# Patient Record
Sex: Male | Born: 1990 | Race: Black or African American | Hispanic: No | Marital: Single | Smoking: Current every day smoker
Health system: Southern US, Community
[De-identification: ages and names within clinical notes are randomized; demographics above are authoritative.]

## PROBLEM LIST (undated history)

## (undated) DIAGNOSIS — J45909 Unspecified asthma, uncomplicated: Secondary | ICD-10-CM

## (undated) HISTORY — PX: ABDOMINAL SURGERY: SHX537

---
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
## 2008-03-31 ENCOUNTER — Emergency Department: Payer: Self-pay | Admitting: Emergency Medicine

## 2009-04-01 ENCOUNTER — Emergency Department: Payer: Self-pay | Admitting: Emergency Medicine

## 2009-11-26 ENCOUNTER — Emergency Department: Payer: Self-pay | Admitting: Emergency Medicine

## 2014-10-30 ENCOUNTER — Emergency Department: Payer: Self-pay | Admitting: Emergency Medicine

## 2014-10-30 LAB — URINALYSIS, COMPLETE
Bacteria: NONE SEEN
Bilirubin,UR: NEGATIVE
Glucose,UR: NEGATIVE mg/dL (ref 0–75)
KETONE: NEGATIVE
Nitrite: NEGATIVE
PH: 8 (ref 4.5–8.0)
Protein: 100
Specific Gravity: 1.02 (ref 1.003–1.030)
Squamous Epithelial: NONE SEEN
WBC UR: 1195 /HPF (ref 0–5)

## 2014-10-30 LAB — GC/CHLAMYDIA PROBE AMP

## 2014-11-01 LAB — URINE CULTURE

## 2014-12-11 ENCOUNTER — Emergency Department: Payer: Self-pay | Admitting: Emergency Medicine

## 2015-05-09 ENCOUNTER — Encounter: Payer: Self-pay | Admitting: Emergency Medicine

## 2015-05-09 ENCOUNTER — Emergency Department
Admission: EM | Admit: 2015-05-09 | Discharge: 2015-05-09 | Disposition: A | Discharge status: Home / Self Care | Payer: Self-pay | Attending: Student | Admitting: Student

## 2015-05-09 DIAGNOSIS — X58XXXA Exposure to other specified factors, initial encounter: Secondary | ICD-10-CM | POA: Insufficient documentation

## 2015-05-09 DIAGNOSIS — Y9289 Other specified places as the place of occurrence of the external cause: Secondary | ICD-10-CM | POA: Insufficient documentation

## 2015-05-09 DIAGNOSIS — Y998 Other external cause status: Secondary | ICD-10-CM | POA: Insufficient documentation

## 2015-05-09 DIAGNOSIS — S30812A Abrasion of penis, initial encounter: Secondary | ICD-10-CM | POA: Insufficient documentation

## 2015-05-09 DIAGNOSIS — Z720 Tobacco use: Secondary | ICD-10-CM | POA: Insufficient documentation

## 2015-05-09 DIAGNOSIS — Y9389 Activity, other specified: Secondary | ICD-10-CM | POA: Insufficient documentation

## 2015-05-09 HISTORY — DX: Unspecified asthma, uncomplicated: J45.909

## 2015-05-09 LAB — URINALYSIS COMPLETE WITH MICROSCOPIC (ARMC ONLY)
Bilirubin Urine: NEGATIVE
Glucose, UA: NEGATIVE mg/dL
HGB URINE DIPSTICK: NEGATIVE
Ketones, ur: NEGATIVE mg/dL
Leukocytes, UA: NEGATIVE
Nitrite: NEGATIVE
PH: 6 (ref 5.0–8.0)
Protein, ur: 30 mg/dL — AB
SPECIFIC GRAVITY, URINE: 1.031 — AB (ref 1.005–1.030)

## 2015-05-09 NOTE — Discharge Instructions (Signed)
Abrasions An abrasion is a cut or scrape of the skin. Abrasions do not go through all layers of the skin. HOME CARE  If a bandage (dressing) was put on your wound, change it as told by your doctor. If the bandage sticks, soak it off with warm.  Wash the area with water and soap 2 times a day. Rinse off the soap. Pat the area dry with a clean towel.  Put on medicated cream (ointment) as told by your doctor.  Change your bandage right away if it gets wet or dirty.  Only take medicine as told by your doctor.  See your doctor within 24-48 hours to get your wound checked.  Check your wound for redness, puffiness (swelling), or yellowish-white fluid (pus). GET HELP RIGHT AWAY IF:   You have more pain in the wound.  You have redness, swelling, or tenderness around the wound.  You have pus coming from the wound.  You have a fever or lasting symptoms for more than 2-3 days.  You have a fever and your symptoms suddenly get worse.  You have a bad smell coming from the wound or bandage. MAKE SURE YOU:   Understand these instructions.  Will watch your condition.  Will get help right away if you are not doing well or get worse. Document Released: 05/16/2008 Document Revised: 08/22/2012 Document Reviewed: 11/01/2011 Exit[employer] Patient Information 2015 [employer]. This information is not intended to replace advice given to you by your health care provider. Make sure you discuss any questions you have with your health care provider.  

## 2015-05-09 NOTE — ED Provider Notes (Signed)
[facility] Emergency Department Provider Note  ____________________________________________  Time seen: Approximately 8:41 AM  I have reviewed the triage vital signs and the nursing notes.   HISTORY  Chief Complaint Groin Swelling    HPI Jeffrey Aguirre is a 24 y.o. male patient state he knows some mild pedal edema this morning upon awakening. Denies any dysuria or discharge from the penis. Patient state he is concerned because he was treated for STD a few months ago. State he has been monogamous since the incident. He stated there is no pain but he is concerned since he is is getting married in a couple weeks.   Past Medical History  Diagnosis Date  . Asthma     There are no active problems to display for this patient.   Past Surgical History  Procedure Laterality Date  . Abdominal surgery      No current outpatient prescriptions on file.  Allergies Review of patient's allergies indicates no known allergies.  No family history on file.  Social History History  Substance Use Topics  . Smoking status: Current Every Day Smoker -- 0.50 packs/day    Types: Cigarettes  . Smokeless tobacco: Not on file  . Alcohol Use: Not on file    Review of Systems Constitutional: No fever/chills Eyes: No visual changes. ENT: No sore throat. Cardiovascular: Denies chest pain. Respiratory: Denies shortness of breath. Gastrointestinal: No abdominal pain.  No nausea, no vomiting.  No diarrhea.  No constipation. Genitourinary: Negative for dysuria. States mild edema to the penis meatus. Musculoskeletal: Negative for back pain. Skin: Negative for rash. Neurological: Negative for headaches, focal weakness or numbness. 10-point ROS otherwise negative.  ____________________________________________   PHYSICAL EXAM:  VITAL SIGNS: ED Triage Vitals  Enc Vitals Group     BP 05/09/15 0817 139/87 mmHg     Pulse Rate 05/09/15 0817 77     Resp --      Temp  05/09/15 0817 98.1 F (36.7 C)     Temp Source 05/09/15 0817 Oral     SpO2 05/09/15 0817 98 %     Weight 05/09/15 0817 185 lb (83.915 kg)     Height 05/09/15 0817 5\' 9"  (1.753 m)     Head Cir --      Peak Flow --      Pain Score 05/09/15 0818 0     Pain Loc --      Pain Edu? --      Excl. in GC? --    Constitutional: Alert and oriented. Well appearing and in no acute distress. Eyes: Conjunctivae are normal. PERRL. EOMI. Head: Atraumatic. Nose: No congestion/rhinnorhea. Mouth/Throat: Mucous membranes are moist.  Oropharynx non-erythematous. Neck: No stridor.   Hematological/Lymphatic/Immunilogical: No inguinal adenoapthy. Cardiovascular: Normal rate, regular rhythm. Grossly normal heart sounds.  Good peripheral circulation. Respiratory: Normal respiratory effort.  No retractions. Lungs CTAB. Gastrointestinal: Soft and nontender. No distention. No abdominal bruits. No CVA tenderness. Genitourinary: There is lesion consistent for an abrasion to the meatus Musculoskeletal: No lower extremity tenderness nor edema.  No joint effusions. Neurologic:  Normal speech and language. No gross focal neurologic deficits are appreciated. Speech is normal. No gait instability. Skin:  Skin is warm, dry and intact. No rash noted. Psychiatric: Mood and affect are normal. Speech and behavior are normal.  ____________________________________________   LABS (all labs ordered are listed, but only abnormal results are displayed)  Labs Reviewed  URINALYSIS COMPLETEWITH MICROSCOPIC ([facility] ONLY) - Abnormal; Notable for the following:  Color, Urine YELLOW (*)    APPearance CLEAR (*)    Specific Gravity, Urine 1.031 (*)    Protein, ur 30 (*)    Bacteria, UA RARE (*)    Squamous Epithelial / LPF 0-5 (*)    All other components within normal limits    ____________________________________________  EKG   ____________________________________________  RADIOLOGY   ____________________________________________   PROCEDURES  Procedure(s) performed: None  Critical Care performed: No  ____________________________________________   INITIAL IMPRESSION / ASSESSMENT AND PLAN / ED COURSE  Pertinent labs & imaging results that were available during my care of the patient were reviewed by me and considered in my medical decision making (see chart for details). Penial abrasion ____________________________________________   FINAL CLINICAL IMPRESSION(S) / ED DIAGNOSES  Final diagnoses:  Penile abrasion, initial encounter      Joni Reining, PA-C 05/09/15 0981  Gayla Doss, MD 05/09/15 [phone]

## 2015-05-09 NOTE — ED Notes (Signed)
Penis swelling , noticed this am, no injury. Hx STD's a few months ago with treatment

## 2015-11-13 ENCOUNTER — Emergency Department
Admission: EM | Admit: 2015-11-13 | Discharge: 2015-11-13 | Disposition: A | Discharge status: Home / Self Care | Payer: Self-pay | Attending: Emergency Medicine | Admitting: Emergency Medicine

## 2015-11-13 ENCOUNTER — Encounter: Payer: Self-pay | Admitting: Emergency Medicine

## 2015-11-13 DIAGNOSIS — F17210 Nicotine dependence, cigarettes, uncomplicated: Secondary | ICD-10-CM | POA: Insufficient documentation

## 2015-11-13 DIAGNOSIS — W2209XA Striking against other stationary object, initial encounter: Secondary | ICD-10-CM | POA: Insufficient documentation

## 2015-11-13 DIAGNOSIS — Y92002 Bathroom of unspecified non-institutional (private) residence single-family (private) house as the place of occurrence of the external cause: Secondary | ICD-10-CM | POA: Insufficient documentation

## 2015-11-13 DIAGNOSIS — Y990 Civilian activity done for income or pay: Secondary | ICD-10-CM | POA: Insufficient documentation

## 2015-11-13 DIAGNOSIS — Y9389 Activity, other specified: Secondary | ICD-10-CM | POA: Insufficient documentation

## 2015-11-13 DIAGNOSIS — K4090 Unilateral inguinal hernia, without obstruction or gangrene, not specified as recurrent: Secondary | ICD-10-CM | POA: Insufficient documentation

## 2015-11-13 DIAGNOSIS — S3994XA Unspecified injury of external genitals, initial encounter: Secondary | ICD-10-CM | POA: Insufficient documentation

## 2015-11-13 LAB — URINALYSIS COMPLETE WITH MICROSCOPIC (ARMC ONLY)
BILIRUBIN URINE: NEGATIVE
Bacteria, UA: NONE SEEN
Glucose, UA: NEGATIVE mg/dL
HGB URINE DIPSTICK: NEGATIVE
Ketones, ur: NEGATIVE mg/dL
LEUKOCYTES UA: NEGATIVE
Nitrite: NEGATIVE
PH: 6 (ref 5.0–8.0)
PROTEIN: 30 mg/dL — AB
Specific Gravity, Urine: 1.033 — ABNORMAL HIGH (ref 1.005–1.030)

## 2015-11-13 MED ORDER — NAPROXEN 500 MG PO TABS: 500.0000 mg | ORAL_TABLET | Freq: Two times a day (BID) | ORAL | Status: DC

## 2015-11-13 NOTE — ED Provider Notes (Signed)
[facility] Emergency Department Provider Note  ____________________________________________  Time seen: Approximately 11:09 AM  I have reviewed the triage vital signs and the nursing notes.   HISTORY  Chief Complaint Groin Pain    HPI Jeffrey Aguirre is a 24 y.o. male who presents emergency department for 2 complaints. He states that he was at work when he sat down to use the bathroom and "my penis hit the ball and water." He states that since then he has noticed that the glans of his penis looks "different." He denies any sores, drainage or discharge, dysuria, hematuria, polyuria. Patient denies any fevers or chills or abdominal pain. Patient is also complaining of a "bulge in my groin." He states that his in there for "a while." He states that it is painful if he palpates the area or does a lot of lifting. Bulges on the left side. He denies any change in baseline pain. There is no pain without movement or palpation. It is mild to moderate.   Past Medical History  Diagnosis Date  . Asthma     There are no active problems to display for this patient.   Past Surgical History  Procedure Laterality Date  . Abdominal surgery      Current Outpatient Rx  Name  Route  Sig  Dispense  Refill  . naproxen (NAPROSYN) 500 MG tablet   Oral   Take 1 tablet (500 mg total) by mouth 2 (two) times daily with a meal.   60 tablet   2     Allergies Review of patient's allergies indicates no known allergies.  No family history on file.  Social History Social History  Substance Use Topics  . Smoking status: Current Every Day Smoker -- 0.50 packs/day    Types: Cigarettes  . Smokeless tobacco: None  . Alcohol Use: None    Review of Systems Constitutional: No fever/chills Eyes: No visual changes. ENT: No sore throat. Cardiovascular: Denies chest pain. Respiratory: Denies shortness of breath. Gastrointestinal: No abdominal pain.  No nausea, no vomiting.  No  diarrhea.  No constipation. Genitourinary: Negative for dysuria. Endorses swollen penile head. Endorses bulge in left groin. Musculoskeletal: Negative for back pain. Skin: Negative for rash. Neurological: Negative for headaches, focal weakness or numbness.  10-point ROS otherwise negative.  ____________________________________________   PHYSICAL EXAM:  VITAL SIGNS: ED Triage Vitals  Enc Vitals Group     BP 11/13/15 1036 131/86 mmHg     Pulse Rate 11/13/15 1036 81     Resp 11/13/15 1036 18     Temp 11/13/15 1036 98.3 F (36.8 C)     Temp Source 11/13/15 1036 Oral     SpO2 11/13/15 1036 98 %     Weight 11/13/15 1036 200 lb (90.719 kg)     Height 11/13/15 1036  (1.753 m)     Head Cir --      Peak Flow --      Pain Score 11/13/15 1013 7     Pain Loc --      Pain Edu? --      Excl. in GC? --     Constitutional: Alert and oriented. Well appearing and in no acute distress. Eyes: Conjunctivae are normal. PERRL. EOMI. Head: Atraumatic. Nose: No congestion/rhinnorhea. Mouth/Throat: Mucous membranes are moist.  Oropharynx non-erythematous. Neck: No stridor.   Cardiovascular: Normal rate, regular rhythm. Grossly normal heart sounds.  Good peripheral circulation. Respiratory: Normal respiratory effort.  No retractions. Lungs CTAB. Gastrointestinal: Soft  and nontender. No distention. No abdominal bruits. No CVA tenderness. Genitourinary: External genitalia exam. No gross abnormality visualized. No chancres or lesions are noted. No discharge is noted. Urethral meatus is unremarkable. Testes and scrotum are unremarkable to examination. Palpation to the inguinal canal reveals a bulge and left inguinal canal. Right inguinal canal is unremarkable. Musculoskeletal: No lower extremity tenderness nor edema.  No joint effusions. Neurologic:  Normal speech and language. No gross focal neurologic deficits are appreciated. No gait instability. Skin:  Skin is warm, dry and intact. No rash  noted. Psychiatric: Mood and affect are normal. Speech and behavior are normal.  ____________________________________________   LABS (all labs ordered are listed, but only abnormal results are displayed)  Labs Reviewed  URINALYSIS COMPLETEWITH MICROSCOPIC ([facility] ONLY) - Abnormal; Notable for the following:    Color, Urine AMBER (*)    APPearance CLEAR (*)    Specific Gravity, Urine 1.033 (*)    Protein, ur 30 (*)    Squamous Epithelial / LPF 0-5 (*)    All other components within normal limits   ____________________________________________  EKG   ____________________________________________  RADIOLOGY   ____________________________________________   PROCEDURES  Procedure(s) performed: None  Critical Care performed: No  ____________________________________________   INITIAL IMPRESSION / ASSESSMENT AND PLAN / ED COURSE  Pertinent labs & imaging results that were available during my care of the patient were reviewed by me and considered in my medical decision making (see chart for details).  The patient's history, symptoms, physical exam are consistent with left-sided inguinal hernia. No signs of infection in urinalysis. I'll discharge the patient home with anti-inflammatories for symptomatic control if he will hernia advised patient to follow-up with general surgeon. Patient verbalizes understanding of the diagnosis and treatment plan and verbalizes compliance with same. ____________________________________________   FINAL CLINICAL IMPRESSION(S) / ED DIAGNOSES  Final diagnoses:  Left inguinal hernia      Racheal PatchesJonathan D Jlyn Bracamonte, PA-C 11/13/15 1240  Emily FilbertJonathan E Williams, MD 11/13/15 1323

## 2015-11-13 NOTE — Discharge Instructions (Signed)

## 2015-11-13 NOTE — ED Notes (Signed)
States he fell   Having pain to groin area

## 2015-12-17 ENCOUNTER — Ambulatory Visit: Payer: Self-pay | Admitting: Surgery

## 2016-01-20 ENCOUNTER — Ambulatory Visit (INDEPENDENT_AMBULATORY_CARE_PROVIDER_SITE_OTHER): Payer: Self-pay | Admitting: Surgery

## 2016-01-20 DIAGNOSIS — Z029 Encounter for administrative examinations, unspecified: Secondary | ICD-10-CM

## 2016-01-22 NOTE — Progress Notes (Signed)
No show

## 2016-11-10 ENCOUNTER — Emergency Department
Admission: EM | Admit: 2016-11-10 | Discharge: 2016-11-10 | Disposition: A | Discharge status: Home / Self Care | Payer: Self-pay | Attending: Emergency Medicine | Admitting: Emergency Medicine

## 2016-11-10 ENCOUNTER — Encounter: Payer: Self-pay | Admitting: Emergency Medicine

## 2016-11-10 DIAGNOSIS — R112 Nausea with vomiting, unspecified: Secondary | ICD-10-CM | POA: Insufficient documentation

## 2016-11-10 DIAGNOSIS — J45909 Unspecified asthma, uncomplicated: Secondary | ICD-10-CM | POA: Insufficient documentation

## 2016-11-10 DIAGNOSIS — F17210 Nicotine dependence, cigarettes, uncomplicated: Secondary | ICD-10-CM | POA: Insufficient documentation

## 2016-11-10 DIAGNOSIS — R197 Diarrhea, unspecified: Secondary | ICD-10-CM | POA: Insufficient documentation

## 2016-11-10 LAB — COMPREHENSIVE METABOLIC PANEL
ALBUMIN: 4.3 g/dL (ref 3.5–5.0)
ALT: 17 U/L (ref 17–63)
AST: 22 U/L (ref 15–41)
Alkaline Phosphatase: 40 U/L (ref 38–126)
Anion gap: 6 (ref 5–15)
BILIRUBIN TOTAL: 0.4 mg/dL (ref 0.3–1.2)
BUN: 9 mg/dL (ref 6–20)
CHLORIDE: 107 mmol/L (ref 101–111)
CO2: 25 mmol/L (ref 22–32)
CREATININE: 1.23 mg/dL (ref 0.61–1.24)
Calcium: 9 mg/dL (ref 8.9–10.3)
GFR calc Af Amer: >60 mL/min (ref >60)
GLUCOSE: 93 mg/dL (ref 65–99)
Potassium: 3.9 mmol/L (ref 3.5–5.1)
Sodium: 138 mmol/L (ref 135–145)
TOTAL PROTEIN: 7.3 g/dL (ref 6.5–8.1)

## 2016-11-10 LAB — CBC
HEMATOCRIT: 45.8 % (ref 40.0–52.0)
Hemoglobin: 15.9 g/dL (ref 13.0–18.0)
MCH: 33.4 pg (ref 26.0–34.0)
MCHC: 34.8 g/dL (ref 32.0–36.0)
MCV: 96 fL (ref 80.0–100.0)
PLATELETS: 217 10*3/uL (ref 150–440)
RBC: 4.77 MIL/uL (ref 4.40–5.90)
RDW: 14.3 % (ref 11.5–14.5)
WBC: 7.8 10*3/uL (ref 3.8–10.6)

## 2016-11-10 LAB — LIPASE, BLOOD: Lipase: 37 U/L (ref 11–51)

## 2016-11-10 MED ORDER — ONDANSETRON HCL 4 MG PO TABS: 4.0000 mg | ORAL_TABLET | Freq: Three times a day (TID) | ORAL | 0 refills | Status: DC | PRN

## 2016-11-10 MED ORDER — SODIUM CHLORIDE 0.9 % IV BOLUS (SEPSIS)
1000.0000 mL | Freq: Once | INTRAVENOUS | Status: AC
  Administered 2016-11-10: 1000 mL via INTRAVENOUS

## 2016-11-10 MED ORDER — ONDANSETRON HCL 4 MG/2ML IJ SOLN
4.0000 mg | Freq: Once | INTRAMUSCULAR | Status: AC
  Administered 2016-11-10: 4 mg via INTRAVENOUS
  Filled 2016-11-10: qty 2

## 2016-11-10 NOTE — ED Notes (Signed)
Pt sound asleep at present.. Will continue to monitor the pt. 

## 2016-11-10 NOTE — ED Provider Notes (Signed)
[facility] Emergency Department Provider Note ____________________________________________   I have reviewed the triage vital signs and the triage nursing note.  HISTORY  Chief Complaint Emesis and Diarrhea   Historian Patient  HPI Jeffrey Aguirre is a 25 y.o. male present for 24 hours of n/v/d - reports over 30x vomiting, nonbloody, yesterday.  Twice this AM.  Several loose BM.  Moderate abdominal cramping which is waxing and waning in intensity, currently mild.  No significant travel history, no known food exposures. No known sick contacts.  Denies fever. Denies headache, sinus congestion, coughing, chest pain, skin rash.    Past Medical History:  Diagnosis Date  . Asthma     There are no active problems to display for this patient.   Past Surgical History:  Procedure Laterality Date  . ABDOMINAL SURGERY      Prior to Admission medications   Medication Sig Start Date End Date Taking? Authorizing Provider  ondansetron (ZOFRAN) 4 MG tablet Take 1 tablet (4 mg total) by mouth every 8 (eight) hours as needed for nausea or vomiting. 11/10/16   Governor Rooksebecca Donae Kueker, MD    No Known Allergies  No family history on file.  Social History Social History  Substance Use Topics  . Smoking status: Current Every Day Smoker    Packs/day: 0.50    Types: Cigarettes  . Smokeless tobacco: Never Used  . Alcohol use Not on file    Review of Systems  Constitutional: Negative for fever. Eyes: Negative for visual changes. ENT: Negative for sore throat. Cardiovascular: Negative for chest pain. Respiratory: Negative for shortness of breath. Gastrointestinal:As per history of present illness Genitourinary: Negative for dysuria. Musculoskeletal: Negative for back pain. Skin: Negative for rash. Neurological: Negative for headache. 10 point Review of Systems otherwise negative ____________________________________________   PHYSICAL EXAM:  VITAL SIGNS: ED  Triage Vitals   Enc Vitals Group     BP 136/87     Pulse Rate 90     Resp 16     Temp 98.2 F (36.8 C)     Temp Source Oral     SpO2 99 %     Weight 175 lb (79.4 kg)     Height 5' 8.5" (1.74 m)     Head Circumference      Peak Flow      Pain Score      Pain Loc      Pain Edu?      Excl. in GC?      Constitutional: Alert and oriented. Well appearing and in no distress. HEENT   Head: Normocephalic and atraumatic.      Eyes: Conjunctivae are normal. PERRL. Normal extraocular movements.      Ears:         Nose: No congestion/rhinnorhea.   Mouth/Throat: Mucous membranes are moderately dry.   Neck: No stridor. Cardiovascular/Chest: Normal rate, regular rhythm.  No murmurs, rubs, or gallops. Respiratory: Normal respiratory effort without tachypnea nor retractions. Breath sounds are clear and equal bilaterally. No wheezes/rales/rhonchi. Gastrointestinal: Soft. No distention, no guarding, no rebound. Just very mild tenderness diffusely especially in the upper abdomen. No focal right lower quadrant tenderness.  Genitourinary/rectal:Deferred Musculoskeletal: Nontender with normal range of motion in all extremities. No joint effusions.  No lower extremity tenderness.  No edema. Neurologic:  Normal speech and language. No gross or focal neurologic deficits are appreciated. Skin:  Skin is warm, dry and intact. No rash noted. Psychiatric: Mood and affect are normal. Speech and behavior  are normal. Patient exhibits appropriate insight and judgment.   ____________________________________________  LABS (pertinent positives/negatives)  Labs Reviewed  LIPASE, BLOOD  COMPREHENSIVE METABOLIC PANEL  CBC  URINALYSIS COMPLETEWITH MICROSCOPIC ([facility] ONLY)    ____________________________________________    EKG I, Governor Rooksebecca Cabrina Shiroma, MD, the attending physician have personally viewed and interpreted all ECGs.  None ____________________________________________  RADIOLOGY All Xrays  were viewed by me. Imaging interpreted by Radiologist.  None __________________________________________  PROCEDURES  Procedure(s) performed: None  Critical Care performed: None  ____________________________________________   ED COURSE / ASSESSMENT AND PLAN  Pertinent labs & imaging results that were available during my care of the patient were reviewed by me and considered in my medical decision making (see chart for details).   Jeffrey Aguirre is here with vomiting and diarrhea, likely viral given was going on in the community right now. He is overall well-appearing without a fever here, with stable vital signs. He does look dehydrated on exam in terms of dry mucous members of the mouth. I am going to give him IV fluids, nausea medication. Abdominal discomfort is very mild and I'm not concerned about intra-abdominal emergency condition such as appendicitis and I'm not recommending imaging at this point in time.   CONSULTATIONS:  None   Patient / Family / Caregiver informed of clinical course, medical decision-making process, and agree with plan.   I discussed return precautions, follow-up instructions, and discharge instructions with patient and/or family.   ___________________________________________   FINAL CLINICAL IMPRESSION(S) / ED DIAGNOSES   Final diagnoses:  Nausea vomiting and diarrhea              Note: This dictation was prepared with Dragon dictation. Any transcriptional errors that result from this process are unintentional    Governor Rooksebecca Brittan Mapel, MD 11/10/16 1327

## 2016-11-10 NOTE — ED Triage Notes (Signed)
Pt with vomiting and diarrhea started yesterday with some abd pain.

## 2016-11-10 NOTE — Discharge Instructions (Signed)
You were evaluated after vomiting and diarrhea, as we discussed your exam and evaluation are reassuring in the emergency department today and I suspect you have a GI virus.  Continue to drink plenty of fluids. Return to the emergency room for any fever, worsening abdominal pain, continued fluid losses through vomiting and diarrhea with concern for dehydration such as dry mouth and not making urine, dizziness or passing out, or any other symptoms concerning to you.

## 2016-11-23 ENCOUNTER — Emergency Department
Admission: EM | Admit: 2016-11-23 | Discharge: 2016-11-23 | Disposition: A | Discharge status: Home / Self Care | Payer: Self-pay | Attending: Emergency Medicine | Admitting: Emergency Medicine

## 2016-11-23 DIAGNOSIS — L089 Local infection of the skin and subcutaneous tissue, unspecified: Secondary | ICD-10-CM | POA: Insufficient documentation

## 2016-11-23 DIAGNOSIS — Y939 Activity, unspecified: Secondary | ICD-10-CM | POA: Insufficient documentation

## 2016-11-23 DIAGNOSIS — Y929 Unspecified place or not applicable: Secondary | ICD-10-CM | POA: Insufficient documentation

## 2016-11-23 DIAGNOSIS — S50861A Insect bite (nonvenomous) of right forearm, initial encounter: Secondary | ICD-10-CM | POA: Insufficient documentation

## 2016-11-23 DIAGNOSIS — Y999 Unspecified external cause status: Secondary | ICD-10-CM | POA: Insufficient documentation

## 2016-11-23 DIAGNOSIS — J45909 Unspecified asthma, uncomplicated: Secondary | ICD-10-CM | POA: Insufficient documentation

## 2016-11-23 DIAGNOSIS — W57XXXA Bitten or stung by nonvenomous insect and other nonvenomous arthropods, initial encounter: Secondary | ICD-10-CM | POA: Insufficient documentation

## 2016-11-23 DIAGNOSIS — F17210 Nicotine dependence, cigarettes, uncomplicated: Secondary | ICD-10-CM | POA: Insufficient documentation

## 2016-11-23 DIAGNOSIS — S50869A Insect bite (nonvenomous) of unspecified forearm, initial encounter: Secondary | ICD-10-CM

## 2016-11-23 MED ORDER — SULFAMETHOXAZOLE-TRIMETHOPRIM 800-160 MG PO TABS: 1.0000 | ORAL_TABLET | Freq: Two times a day (BID) | ORAL | 0 refills | Status: DC

## 2016-11-23 MED ORDER — METHYLPREDNISOLONE SODIUM SUCC 125 MG IJ SOLR
80.0000 mg | Freq: Once | INTRAMUSCULAR | Status: AC
  Administered 2016-11-23: 80 mg via INTRAMUSCULAR
  Filled 2016-11-23: qty 2

## 2016-11-23 MED ORDER — HYDROXYZINE HCL 50 MG PO TABS
50.0000 mg | ORAL_TABLET | Freq: Once | ORAL | Status: AC
  Administered 2016-11-23: 50 mg via ORAL
  Filled 2016-11-23: qty 1

## 2016-11-23 MED ORDER — HYDROXYZINE HCL 50 MG PO TABS: 50.0000 mg | ORAL_TABLET | Freq: Three times a day (TID) | ORAL | 0 refills | Status: DC | PRN

## 2016-11-23 NOTE — ED Provider Notes (Signed)
[facility] Emergency Department Provider Note   ____________________________________________   First MD Initiated Contact with Patient 11/23/16 1135     (approximate)  I have reviewed the triage vital signs and the nursing notes.   HISTORY  Chief Complaint Wrist Pain    HPI Jeffrey Aguirre is a 25 y.o. male patient complained edema and erythema to right forearm. Patient states status post insect bite 2 days ago. Patient state pain increased this morning he rates as 8/10. Patient also stated this intense itching with this complaint. Patient states transient relief taking Benadryl yesterday. Patient denies loss of sensation or loss of function of the right upper extremity. Patient denies any fever with this complaint.   Past Medical History:  Diagnosis Date  . Asthma     There are no active problems to display for this patient.   Past Surgical History:  Procedure Laterality Date  . ABDOMINAL SURGERY      Prior to Admission medications   Medication Sig Start Date End Date Taking? Authorizing Provider  hydrOXYzine (ATARAX/VISTARIL) 50 MG tablet Take 1 tablet (50 mg total) by mouth 3 (three) times daily as needed for itching. 11/23/16   Joni Reiningonald K Antwan Pandya, PA-C  ondansetron (ZOFRAN) 4 MG tablet Take 1 tablet (4 mg total) by mouth every 8 (eight) hours as needed for nausea or vomiting. 11/10/16   Governor Rooksebecca Lord, MD  sulfamethoxazole-trimethoprim (BACTRIM DS,SEPTRA DS) 800-160 MG tablet Take 1 tablet by mouth 2 (two) times daily. 11/23/16   Joni Reiningonald K Bhavya Grand, PA-C    Allergies Patient has no known allergies.  No family history on file.  Social History Social History  Substance Use Topics  . Smoking status: Current Every Day Smoker    Packs/day: 0.50    Types: Cigarettes  . Smokeless tobacco: Never Used  . Alcohol use Not on file    Review of Systems Constitutional: No fever/chills Eyes: No visual changes. ENT: No sore throat. Cardiovascular:  Denies chest pain. Respiratory: Denies shortness of breath. Gastrointestinal: No abdominal pain.  No nausea, no vomiting.  No diarrhea.  No constipation. Genitourinary: Negative for dysuria. Musculoskeletal: Negative for back pain. Skin: Negative for rash. Redness and swelling to right forearm.  Neurological: Negative for headaches, focal weakness or numbness.    ____________________________________________   PHYSICAL EXAM:  VITAL SIGNS: ED Triage Vitals   Enc Vitals Group     BP 130/80     Pulse Rate 72     Resp 17     Temp 98 F (36.7 C)     Temp Source Oral     SpO2 99 %     Weight 175 lb (79.4 kg)     Height 5\' 8"  (1.727 m)     Head Circumference      Peak Flow      Pain Score 8     Pain Loc      Pain Edu?      Excl. in GC?     Constitutional: Alert and oriented. Well appearing and in no acute distress. Eyes: Conjunctivae are normal. PERRL. EOMI. Head: Atraumatic. Nose: No congestion/rhinnorhea. Mouth/Throat: Mucous membranes are moist.  Oropharynx non-erythematous. Neck: No stridor.  No cervical spine tenderness to palpation. Hematological/Lymphatic/Immunilogical: No cervical lymphadenopathy. Cardiovascular: Normal rate, regular rhythm. Grossly normal heart sounds.  Good peripheral circulation. Respiratory: Normal respiratory effort.  No retractions. Lungs CTAB. Gastrointestinal: Soft and nontender. No distention. No abdominal bruits. No CVA tenderness. Musculoskeletal: No lower extremity tenderness nor edema.  No joint effusions. Neurologic:  Normal speech and language. No gross focal neurologic deficits are appreciated. No gait instability. Skin:  Skin is warm, dry and intact. No rash noted.Edema and erythema right forearm. Psychiatric: Mood and affect are normal. Speech and behavior are normal.  ____________________________________________   LABS (all labs ordered are listed, but only abnormal results are displayed)  Labs Reviewed - No data to  display ____________________________________________  EKG  ____________________________________________  RADIOLOGY   ____________________________________________   PROCEDURES  Procedure(s) performed:   Procedures  Critical Care performed: No  ____________________________________________   INITIAL IMPRESSION / ASSESSMENT AND PLAN / ED COURSE  Pertinent labs & imaging results that were available during my care of the patient were reviewed by me and considered in my medical decision making (see chart for details).  Cellulitis secondary to insect bite. Patient given discharge care instructions. Patient given a prescription for Bactrim DS and Atarax. Patient advised [facility].  Clinical Course   Patient given Solu Medrol and Atarax prior to departure.   ____________________________________________   FINAL CLINICAL IMPRESSION(S) / ED DIAGNOSES  Final diagnoses:  Infected insect bite of forearm, initial encounter      NEW MEDICATIONS STARTED DURING THIS VISIT:  New Prescriptions   HYDROXYZINE (ATARAX/VISTARIL) 50 MG TABLET    Take 1 tablet (50 mg total) by mouth 3 (three) times daily as needed for itching.   SULFAMETHOXAZOLE-TRIMETHOPRIM (BACTRIM DS,SEPTRA DS) 800-160 MG TABLET    Take 1 tablet by mouth 2 (two) times daily.     Note:  This document was prepared using Dragon voice recognition software and may include unintentional dictation errors.    Joni Reiningonald K Ryan Ogborn, PA-C 11/23/16 1143    Jennye MoccasinBrian S Quigley, MD 11/23/16 [phone]

## 2016-11-23 NOTE — ED Triage Notes (Signed)
Pt c/o bug biting him on the right FA 2 days and having redness and swelling

## 2017-03-25 ENCOUNTER — Emergency Department: Payer: Self-pay

## 2017-03-25 ENCOUNTER — Encounter: Payer: Self-pay | Admitting: Emergency Medicine

## 2017-03-25 ENCOUNTER — Emergency Department
Admission: EM | Admit: 2017-03-25 | Discharge: 2017-03-25 | Disposition: A | Discharge status: Home / Self Care | Payer: Self-pay | Attending: Emergency Medicine | Admitting: Emergency Medicine

## 2017-03-25 DIAGNOSIS — M436 Torticollis: Secondary | ICD-10-CM

## 2017-03-25 DIAGNOSIS — Z79899 Other long term (current) drug therapy: Secondary | ICD-10-CM | POA: Insufficient documentation

## 2017-03-25 DIAGNOSIS — J45909 Unspecified asthma, uncomplicated: Secondary | ICD-10-CM | POA: Insufficient documentation

## 2017-03-25 DIAGNOSIS — F17210 Nicotine dependence, cigarettes, uncomplicated: Secondary | ICD-10-CM | POA: Insufficient documentation

## 2017-03-25 MED ORDER — NAPROXEN 375 MG PO TABS: 375.0000 mg | ORAL_TABLET | Freq: Two times a day (BID) | ORAL | 0 refills | Status: AC

## 2017-03-25 MED ORDER — CYCLOBENZAPRINE HCL 5 MG PO TABS: 5.0000 mg | ORAL_TABLET | Freq: Three times a day (TID) | ORAL | 0 refills | Status: DC | PRN

## 2017-03-25 NOTE — ED Notes (Signed)
Pt verbalized understanding of discharge instructions. NAD at this time. 

## 2017-03-25 NOTE — ED Notes (Signed)
Pt's shoulders are not symmetrical and aligned at this time.

## 2017-03-25 NOTE — ED Triage Notes (Signed)
Pt states that he has been having muscle pain on the left side of his neck down the left side of his back. Pt states that he cannot move his head up or down due to the pain in his neck. Pt is ambulatory to triage at this time and in NAD.

## 2017-03-25 NOTE — ED Provider Notes (Signed)
[facility] Emergency Department Provider Note  ____________________________________________   I have reviewed the triage vital signs and the nursing notes.   HISTORY  Chief Complaint Muscle Pain    HPI Jeffrey Aguirre is a 26 y.o. male resents today complaining of muscle spasm in the left trapezius muscle for 5 days. He states he sleeps on his stomach with his neck turned to the side and he woke up from slumber for this problem several days ago. He has been taking ibuprofen with minimal relief. The pain is not exertional. It is not radiating anywhere. He has had no radicular symptoms such as numbness or weakness in his arm or hand, he denies any trauma or neck injury, he has no neck pain, he has no headache, he has no fever, the pain is worse when he moves his neck, it is his left trapezius muscle and hurts more when he looks to the left less when he looks to the right. He has had no recollected injury to the area. He states that he has been trying to chase away he seems to avoid this but has not been able to do so because it is a habit. The patient states that he has had no chest pain or shortness of breath no difficulty with walking. The pain is worse when he touches it and better when he leaves it alone. Warm compresses seem to relieve the pain but then it comes back. No leg swelling, no pleuritic pain. No shortness of breath     Past Medical History:  Diagnosis Date  . Asthma     There are no active problems to display for this patient.   Past Surgical History:  Procedure Laterality Date  . ABDOMINAL SURGERY      Prior to Admission medications   Medication Sig Start Date End Date Taking? Authorizing Provider  hydrOXYzine (ATARAX/VISTARIL) 50 MG tablet Take 1 tablet (50 mg total) by mouth 3 (three) times daily as needed for itching. 11/23/16   Joni Reining, PA-C  ondansetron (ZOFRAN) 4 MG tablet Take 1 tablet (4 mg total) by mouth every 8 (eight) hours  as needed for nausea or vomiting. 11/10/16   Governor Rooks, MD  sulfamethoxazole-trimethoprim (BACTRIM DS,SEPTRA DS) 800-160 MG tablet Take 1 tablet by mouth 2 (two) times daily. 11/23/16   Joni Reining, PA-C    Allergies Patient has no known allergies.  No family history on file.  Social History Social History  Substance Use Topics  . Smoking status: Current Every Day Smoker    Packs/day: 0.50    Types: Cigarettes  . Smokeless tobacco: Never Used  . Alcohol use Yes     Comment: rarely    Review of Systems Constitutional: No fever/chills Eyes: No visual changes. ENT: No sore throat. No stiff neck no neck pain Cardiovascular: Denies chest pain. Respiratory: Denies shortness of breath. Gastrointestinal:   no vomiting.  No diarrhea.  No constipation. Genitourinary: Negative for dysuria. Musculoskeletal: Negative lower extremity swelling Skin: Negative for rash. Neurological: Negative for severe headaches, focal weakness or numbness. 10-point ROS otherwise negative.  ____________________________________________   PHYSICAL EXAM:  VITAL SIGNS: ED Triage Vitals  Enc Vitals Group     BP 03/25/17 0431 (!) 131/104     Pulse Rate 03/25/17 0431 86     Resp 03/25/17 0431 18     Temp 03/25/17 0431 98.4 F (36.9 C)     Temp Source 03/25/17 0431 Oral     SpO2 03/25/17  0431 97 %     Weight 03/25/17 0432 180 lb (81.6 kg)     Height 03/25/17 0432  (1.753 m)     Head Circumference --      Peak Flow --      Pain Score 03/25/17 0430 10     Pain Loc --      Pain Edu? --      Excl. in GC? --     Constitutional: Alert and oriented. Well appearing and in no acute distress. Reading his cell phone with both hands holding it with no acute distress noted Eyes: Conjunctivae are normal. PERRL. EOMI. Head: Atraumatic. Nose: No congestion/rhinnorhea. Mouth/Throat: Mucous membranes are moist.  Oropharynx non-erythematous. Neck: No stridor.   There is no midline tenderness to the  neck, there is, however, some mild muscle spasm noted in the left trapezius muscle which reproduces pain. No shingles lesions. It is not red or hot to touch. No abscess or infection noted. When I touch the patient's trapezius muscle in the mid belly and somewhat more proximally he states "ouch that's the pain right there", there is no rash. There is no loss of sensation. There is no evidence of trauma. The patient can also reproduce the pain by looking to the right and the left and it is worse when he looks towards the lesion Cardiovascular: Normal rate, regular rhythm. Grossly normal heart sounds.  Good peripheral circulation. Respiratory: Normal respiratory effort.  No retractions. Lungs CTAB. Abdominal: Soft and nontender. No distention. No guarding no rebound Back:  There is no focal tenderness or step off.  there is no midline tenderness there are no lesions noted. there is no CVA tenderness Musculoskeletal: No lower extremity tenderness, no upper extremity tenderness. No joint effusions, no DVT signs strong distal pulses no edema Neurologic:  Normal speech and language. No gross focal neurologic deficits are appreciated.  Skin:  Skin is warm, dry and intact. No rash noted. Psychiatric: Mood and affect are normal. Speech and behavior are normal.  ____________________________________________   LABS (all labs ordered are listed, but only abnormal results are displayed)  Labs Reviewed - No data to display ____________________________________________  EKG  I personally interpreted any EKGs ordered by me or triage  ____________________________________________  RADIOLOGY  I reviewed any imaging ordered by me or triage that were performed during my shift and, if possible, patient and/or family made aware of any abnormal findings. ____________________________________________   PROCEDURES  Procedure(s) performed: None  Procedures  Critical Care performed:  None  ____________________________________________   INITIAL IMPRESSION / ASSESSMENT AND PLAN / ED COURSE  Pertinent labs & imaging results that were available during my care of the patient were reviewed by me and considered in my medical decision making (see chart for details).  Patient here with somewhat classic torticollis. Nothing at this time to suggest significant radiculopathy but I did advise him that if he should develop any symptoms consistent with that which I did explain he should return to the emergency room. There is nothing to suggest the patient is having referred cardiac or other intrathoracic pathology. Nothing to suggest that there is a vascular component to this. Nothing to suggest ACS PE or dissection. Nothing to suggest that there is any significant traumatic or other injury or pathology to the spine, nothing to suggest CVA. In short, this appears to be very reproducible muscle spasm from his sleeping position. We will try him on a short course of Flexeril. Return precautions and follow-up have been  given and understood.    ____________________________________________   FINAL CLINICAL IMPRESSION(S) / ED DIAGNOSES  Final diagnoses:  None      This chart was dictated using voice recognition software.  Despite best efforts to proofread,  errors can occur which can change meaning.      Jeanmarie Plant, MD 03/25/17 [phone]

## 2018-04-08 ENCOUNTER — Other Ambulatory Visit: Payer: Self-pay

## 2018-04-08 DIAGNOSIS — Z5321 Procedure and treatment not carried out due to patient leaving prior to being seen by health care provider: Secondary | ICD-10-CM | POA: Insufficient documentation

## 2018-04-08 DIAGNOSIS — R05 Cough: Secondary | ICD-10-CM | POA: Insufficient documentation

## 2018-04-08 MED ORDER — IPRATROPIUM-ALBUTEROL 0.5-2.5 (3) MG/3ML IN SOLN
3.0000 mL | Freq: Once | RESPIRATORY_TRACT | Status: AC
  Administered 2018-04-08: 3 mL via RESPIRATORY_TRACT
  Filled 2018-04-08: qty 3

## 2018-04-08 NOTE — ED Notes (Signed)
Pt finished breathing treatment. Notified RN. DC treatment. Rechecked O2. Pt 95% RA

## 2018-04-08 NOTE — ED Triage Notes (Signed)
Patient to Ed for complaints of cough, chest congestion, feeling of not getting a full breath. Has used albuterol inhaler but doesn't feel like it is helping. Feels a sharp pain in the middle of his chest. Has not "been able to smoke a cigarette in three days."

## 2018-04-09 ENCOUNTER — Emergency Department
Admission: EM | Admit: 2018-04-09 | Discharge: 2018-04-09 | Disposition: A | Discharge status: Home / Self Care | Payer: Self-pay | Attending: Emergency Medicine | Admitting: Emergency Medicine

## 2018-04-09 NOTE — ED Notes (Signed)
Patient called for treatment area x 3 without an answer.  

## 2018-04-21 ENCOUNTER — Encounter: Payer: Self-pay | Admitting: Emergency Medicine

## 2018-04-21 ENCOUNTER — Emergency Department
Admission: EM | Admit: 2018-04-21 | Discharge: 2018-04-21 | Disposition: A | Discharge status: Home / Self Care | Payer: Self-pay | Attending: Emergency Medicine | Admitting: Emergency Medicine

## 2018-04-21 ENCOUNTER — Emergency Department: Payer: Self-pay

## 2018-04-21 DIAGNOSIS — R1030 Lower abdominal pain, unspecified: Secondary | ICD-10-CM | POA: Insufficient documentation

## 2018-04-21 DIAGNOSIS — Z79899 Other long term (current) drug therapy: Secondary | ICD-10-CM | POA: Insufficient documentation

## 2018-04-21 DIAGNOSIS — N3001 Acute cystitis with hematuria: Secondary | ICD-10-CM | POA: Insufficient documentation

## 2018-04-21 DIAGNOSIS — F17210 Nicotine dependence, cigarettes, uncomplicated: Secondary | ICD-10-CM | POA: Insufficient documentation

## 2018-04-21 DIAGNOSIS — J45909 Unspecified asthma, uncomplicated: Secondary | ICD-10-CM | POA: Insufficient documentation

## 2018-04-21 LAB — COMPREHENSIVE METABOLIC PANEL
ALT: 17 U/L (ref 17–63)
AST: 19 U/L (ref 15–41)
Albumin: 4.7 g/dL (ref 3.5–5.0)
Alkaline Phosphatase: 58 U/L (ref 38–126)
Anion gap: 7 (ref 5–15)
BUN: 16 mg/dL (ref 6–20)
CHLORIDE: 101 mmol/L (ref 101–111)
CO2: 28 mmol/L (ref 22–32)
Calcium: 9.1 mg/dL (ref 8.9–10.3)
Creatinine, Ser: 1.3 mg/dL — ABNORMAL HIGH (ref 0.61–1.24)
Glucose, Bld: 89 mg/dL (ref 65–99)
POTASSIUM: 3.9 mmol/L (ref 3.5–5.1)
Sodium: 136 mmol/L (ref 135–145)
Total Bilirubin: 1 mg/dL (ref 0.3–1.2)
Total Protein: 7.9 g/dL (ref 6.5–8.1)

## 2018-04-21 LAB — URINALYSIS, COMPLETE (UACMP) WITH MICROSCOPIC
BACTERIA UA: NONE SEEN
BILIRUBIN URINE: NEGATIVE
Glucose, UA: NEGATIVE mg/dL
Ketones, ur: 5 mg/dL — AB
Nitrite: NEGATIVE
PROTEIN: 30 mg/dL — AB
Specific Gravity, Urine: 1.032 — ABNORMAL HIGH (ref 1.005–1.030)
Squamous Epithelial / LPF: NONE SEEN (ref 0–5)
pH: 6 (ref 5.0–8.0)

## 2018-04-21 LAB — CBC
HCT: 48.4 % (ref 40.0–52.0)
HEMOGLOBIN: 16.9 g/dL (ref 13.0–18.0)
MCH: 33.3 pg (ref 26.0–34.0)
MCHC: 34.9 g/dL (ref 32.0–36.0)
MCV: 95.4 fL (ref 80.0–100.0)
Platelets: 263 10*3/uL (ref 150–440)
RBC: 5.08 MIL/uL (ref 4.40–5.90)
RDW: 14.3 % (ref 11.5–14.5)
WBC: 13 10*3/uL — AB (ref 3.8–10.6)

## 2018-04-21 LAB — LIPASE, BLOOD: LIPASE: 30 U/L (ref 11–51)

## 2018-04-21 MED ORDER — CIPROFLOXACIN HCL 500 MG PO TABS: 500.0000 mg | ORAL_TABLET | Freq: Two times a day (BID) | ORAL | 0 refills | Status: DC
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
## 2018-04-21 NOTE — ED Notes (Signed)
AAOx3.  Skin warm and dry.  NAD 

## 2018-04-21 NOTE — ED Provider Notes (Signed)
[facility] Emergency Department Provider Note   ____________________________________________    I have reviewed the triage vital signs and the nursing notes.   HISTORY  Chief Complaint Hematuria     HPI Jeffrey Aguirre is a 27 y.o. male who presents with complaints of hematuria.  Patient notes that when he urinated this morning he noted some clots in his urine.  Denies flank pain, does report mild to moderate suprapubic aching.  Denies fevers.  No nausea or vomiting.  No history of abdominal surgery.  No testicular pain.  No penile discharge.  No history of UTI   Past Medical History:  Diagnosis Date  . Asthma     There are no active problems to display for this patient.   Past Surgical History:  Procedure Laterality Date  . ABDOMINAL SURGERY      Prior to Admission medications   Medication Sig Start Date End Date Taking? Authorizing Provider  ciprofloxacin (CIPRO) 500 MG tablet Take 1 tablet (500 mg total) by mouth 2 (two) times daily. 04/21/18   Jene Every, MD  cyclobenzaprine (FLEXERIL) 5 MG tablet Take 1 tablet (5 mg total) by mouth 3 (three) times daily as needed for muscle spasms. 03/25/17   Jeanmarie Plant, MD  hydrOXYzine (ATARAX/VISTARIL) 50 MG tablet Take 1 tablet (50 mg total) by mouth 3 (three) times daily as needed for itching. 11/23/16   Joni Reining, PA-C  ondansetron (ZOFRAN) 4 MG tablet Take 1 tablet (4 mg total) by mouth every 8 (eight) hours as needed for nausea or vomiting. 11/10/16   Governor Rooks, MD  sulfamethoxazole-trimethoprim (BACTRIM DS,SEPTRA DS) 800-160 MG tablet Take 1 tablet by mouth 2 (two) times daily. 11/23/16   Joni Reining, PA-C     Allergies Patient has no known allergies.  No family history on file.  Social History Social History   Tobacco Use  . Smoking status: Current Every Day Smoker    Packs/day: 0.50    Types: Cigarettes  . Smokeless tobacco: Never Used  Substance Use Topics  .  Alcohol use: Yes    Comment: rarely  . Drug use: No    Review of Systems  Constitutional: No fever/chills Eyes: No visual changes.  ENT: No neck pain Cardiovascular: Denies chest pain. Respiratory: Denies shortness of breath. Gastrointestinal: As above Genitourinary: Hematuria as above Musculoskeletal: Negative for back pain. Skin: Negative for rash. Neurological: History of migraines, not currently   ____________________________________________   PHYSICAL EXAM:  VITAL SIGNS: ED Triage Vitals  Enc Vitals Group     BP 04/21/18 0712 (!) 137/93     Pulse Rate 04/21/18 0712 78     Resp 04/21/18 0712 20     Temp 04/21/18 0712 98.9 F (37.2 C)     Temp Source 04/21/18 0712 Oral     SpO2 04/21/18 0712 99 %     Weight 04/21/18 0713 90.7 kg (200 lb)     Height 04/21/18 0713 1.74 m (5' 8.5")     Head Circumference --      Peak Flow --      Pain Score 04/21/18 0716 8     Pain Loc --      Pain Edu? --      Excl. in GC? --     Constitutional: Alert and oriented. No acute distress.  Eyes: Conjunctivae are normal.   Nose: No congestion/rhinnorhea. Mouth/Throat: Mucous membranes are moist.    Cardiovascular: Normal rate, regular rhythm.  Good peripheral circulation. Respiratory: Normal respiratory effort.  No retractions.  Gastrointestinal: Soft and nontender. No distention.    Musculoskeletal: No lower extremity tenderness nor edema.  Warm and well perfused Neurologic:  Normal speech and language. No gross focal neurologic deficits are appreciated.  Skin:  Skin is warm, dry and intact. No rash noted. Psychiatric: Mood and affect are normal. Speech and behavior are normal.  ____________________________________________   LABS (all labs ordered are listed, but only abnormal results are displayed)  Labs Reviewed  URINALYSIS, COMPLETE (UACMP) WITH MICROSCOPIC - Abnormal; Notable for the following components:      Result Value   Color, Urine YELLOW (*)    APPearance  CLEAR (*)    Specific Gravity, Urine 1.032 (*)    Hgb urine dipstick MODERATE (*)    Ketones, ur 5 (*)    Protein, ur 30 (*)    Leukocytes, UA TRACE (*)    All other components within normal limits  COMPREHENSIVE METABOLIC PANEL - Abnormal; Notable for the following components:   Creatinine, Ser 1.30 (*)    All other components within normal limits  CBC - Abnormal; Notable for the following components:   WBC 13.0 (*)    All other components within normal limits  URINE CULTURE  LIPASE, BLOOD   ____________________________________________  EKG  None ____________________________________________  RADIOLOGY  CT renal stone study unremarkable ____________________________________________   PROCEDURES  Procedure(s) performed: No  Procedures   Critical Care performed: No ____________________________________________   INITIAL IMPRESSION / ASSESSMENT AND PLAN / ED COURSE  Pertinent labs & imaging results that were available during my care of the patient were reviewed by me and considered in my medical decision making (see chart for details).  Patient presents with primarily isolated hematuria.  Will check urinalysis to evaluate for urinary tract infection.  CT renal stone study to evaluate for stone.  Discussed with the patient the need for outpatient follow-up and the need for cystoscopy  CT scan unremarkable.  Urinalysis suggestive of infection, will treat with antibiotics, need outpatient follow-up reiterated with patient     ____________________________________________   FINAL CLINICAL IMPRESSION(S) / ED DIAGNOSES  Final diagnoses:  Acute cystitis with hematuria        Note:  This document was prepared using Dragon voice recognition software and may include unintentional dictation errors.    Jene Every, MD 04/21/18 [phone]

## 2018-04-22 LAB — URINE CULTURE: CULTURE: NO GROWTH

## 2018-05-01 ENCOUNTER — Encounter: Payer: Self-pay | Admitting: Emergency Medicine

## 2018-05-01 ENCOUNTER — Other Ambulatory Visit: Payer: Self-pay

## 2018-05-01 DIAGNOSIS — Y929 Unspecified place or not applicable: Secondary | ICD-10-CM | POA: Insufficient documentation

## 2018-05-01 DIAGNOSIS — S01511A Laceration without foreign body of lip, initial encounter: Secondary | ICD-10-CM | POA: Insufficient documentation

## 2018-05-01 DIAGNOSIS — Y998 Other external cause status: Secondary | ICD-10-CM | POA: Insufficient documentation

## 2018-05-01 DIAGNOSIS — W228XXA Striking against or struck by other objects, initial encounter: Secondary | ICD-10-CM | POA: Insufficient documentation

## 2018-05-01 DIAGNOSIS — Y9389 Activity, other specified: Secondary | ICD-10-CM | POA: Insufficient documentation
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
## 2018-05-02 ENCOUNTER — Encounter: Payer: Self-pay | Admitting: Emergency Medicine

## 2018-05-02 ENCOUNTER — Emergency Department
Admission: EM | Admit: 2018-05-02 | Discharge: 2018-05-02 | Disposition: A | Discharge status: Home / Self Care | Payer: Self-pay | Attending: Emergency Medicine | Admitting: Emergency Medicine

## 2018-05-02 DIAGNOSIS — Y999 Unspecified external cause status: Secondary | ICD-10-CM | POA: Insufficient documentation

## 2018-05-02 DIAGNOSIS — S0181XA Laceration without foreign body of other part of head, initial encounter: Secondary | ICD-10-CM

## 2018-05-02 DIAGNOSIS — J45909 Unspecified asthma, uncomplicated: Secondary | ICD-10-CM | POA: Insufficient documentation

## 2018-05-02 DIAGNOSIS — Y939 Activity, unspecified: Secondary | ICD-10-CM | POA: Insufficient documentation

## 2018-05-02 DIAGNOSIS — F17210 Nicotine dependence, cigarettes, uncomplicated: Secondary | ICD-10-CM | POA: Insufficient documentation

## 2018-05-02 DIAGNOSIS — Y929 Unspecified place or not applicable: Secondary | ICD-10-CM | POA: Insufficient documentation

## 2018-05-02 DIAGNOSIS — S01511A Laceration without foreign body of lip, initial encounter: Secondary | ICD-10-CM | POA: Insufficient documentation

## 2018-05-02 MED ORDER — LIDOCAINE HCL (PF) 1 % IJ SOLN
5.0000 mL | Freq: Once | INTRAMUSCULAR | Status: AC
  Administered 2018-05-02: 5 mL via INTRADERMAL
  Filled 2018-05-02: qty 5

## 2018-05-02 MED ORDER — CEPHALEXIN 500 MG PO CAPS: 500.0000 mg | ORAL_CAPSULE | Freq: Three times a day (TID) | ORAL | 0 refills | Status: DC

## 2018-05-02 MED ORDER — BACITRACIN ZINC 500 UNIT/GM EX OINT
1.0000 "application " | TOPICAL_OINTMENT | Freq: Once | CUTANEOUS | Status: AC
  Administered 2018-05-02: 1 via TOPICAL
  Filled 2018-05-02: qty 0.9

## 2018-05-02 MED ORDER — LIDOCAINE-EPINEPHRINE-TETRACAINE (LET) SOLUTION
3.0000 mL | Freq: Once | NASAL | Status: AC
  Administered 2018-05-02: 3 mL via TOPICAL
  Filled 2018-05-02: qty 3

## 2018-05-02 NOTE — Discharge Instructions (Addendum)
Follow-up with your regular doctor or return to the emergency department in 5 days for suture removal.  You feel comfortable taking her own sutures out and he may do so.  If you develop a headache or vomiting you need to return to the emergency department.  Take the antibiotic due to the delayed closure so your wound will not become infected.
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
## 2018-05-02 NOTE — ED Notes (Signed)
No answer when called several times from lobby 
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
## 2018-05-30 ENCOUNTER — Emergency Department
Admission: EM | Admit: 2018-05-30 | Discharge: 2018-05-30 | Disposition: A | Discharge status: Home / Self Care | Payer: Self-pay | Attending: Emergency Medicine | Admitting: Emergency Medicine

## 2018-05-30 ENCOUNTER — Encounter: Payer: Self-pay | Admitting: Emergency Medicine

## 2018-05-30 DIAGNOSIS — S01511A Laceration without foreign body of lip, initial encounter: Secondary | ICD-10-CM | POA: Insufficient documentation

## 2018-05-30 DIAGNOSIS — Y999 Unspecified external cause status: Secondary | ICD-10-CM | POA: Insufficient documentation

## 2018-05-30 DIAGNOSIS — Z23 Encounter for immunization: Secondary | ICD-10-CM | POA: Insufficient documentation

## 2018-05-30 DIAGNOSIS — Y939 Activity, unspecified: Secondary | ICD-10-CM | POA: Insufficient documentation

## 2018-05-30 DIAGNOSIS — F17210 Nicotine dependence, cigarettes, uncomplicated: Secondary | ICD-10-CM | POA: Insufficient documentation

## 2018-05-30 DIAGNOSIS — Y929 Unspecified place or not applicable: Secondary | ICD-10-CM | POA: Insufficient documentation

## 2018-05-30 DIAGNOSIS — J45909 Unspecified asthma, uncomplicated: Secondary | ICD-10-CM | POA: Insufficient documentation

## 2018-05-30 DIAGNOSIS — S0083XA Contusion of other part of head, initial encounter: Secondary | ICD-10-CM

## 2018-05-30 MED ORDER — TETANUS-DIPHTH-ACELL PERTUSSIS 5-2.5-18.5 LF-MCG/0.5 IM SUSP
0.5000 mL | Freq: Once | INTRAMUSCULAR | Status: AC
Start: 2018-05-30 — End: 2018-05-30
  Administered 2018-05-30: 0.5 mL via INTRAMUSCULAR
  Filled 2018-05-30: qty 0.5

## 2018-05-30 MED ORDER — LIDOCAINE VISCOUS HCL 2 % MT SOLN
15.0000 mL | Freq: Once | OROMUCOSAL | Status: AC
  Administered 2018-05-30: 15 mL via OROMUCOSAL
  Filled 2018-05-30: qty 15
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
## 2018-06-25 ENCOUNTER — Encounter: Payer: Self-pay | Admitting: Emergency Medicine

## 2018-06-25 ENCOUNTER — Emergency Department
Admission: EM | Admit: 2018-06-25 | Discharge: 2018-06-25 | Disposition: A | Discharge status: Home / Self Care | Payer: Self-pay | Attending: Emergency Medicine | Admitting: Emergency Medicine

## 2018-06-25 DIAGNOSIS — Z202 Contact with and (suspected) exposure to infections with a predominantly sexual mode of transmission: Secondary | ICD-10-CM | POA: Insufficient documentation

## 2018-06-25 DIAGNOSIS — J45909 Unspecified asthma, uncomplicated: Secondary | ICD-10-CM | POA: Insufficient documentation

## 2018-06-25 DIAGNOSIS — F17210 Nicotine dependence, cigarettes, uncomplicated: Secondary | ICD-10-CM | POA: Insufficient documentation

## 2018-06-25 DIAGNOSIS — Z711 Person with feared health complaint in whom no diagnosis is made: Secondary | ICD-10-CM

## 2018-06-25 NOTE — ED Triage Notes (Signed)
Patient presents to ED via POV from home with girlfriend. Patient states his girlfriend was recently dx with herpes and he wants to be checked for the same. Denies symptoms.

## 2018-06-25 NOTE — ED Notes (Signed)
See triage note  States he thinks he was exposed to herpes  No signs on arrival

## 2018-06-25 NOTE — ED Provider Notes (Signed)
Time seen: 1524  I have reviewed the triage vital signs and the nursing notes.  [facility] Emergency Department Provider Note ____________________________________________  HISTORY  Chief Complaint  Exposure to STD   HPI Jeffrey Aguirre is a 27 y.o. male presents to the ED accompanied by his pregnant girlfriend. She was apparently notified today during a follow-up appointment with the [facility], that she has tested positive HSV. She was told to notify her partner, and that he should report to the ED for "testing." He denies any penile discharge, painful lesions, blisters, sores, or dysuria.   Past Medical History:  Diagnosis Date  . Asthma     There are no active problems to display for this patient.   Past Surgical History:  Procedure Laterality Date  . ABDOMINAL SURGERY      Prior to Admission medications   Medication Sig Start Date End Date Taking? Authorizing Provider  albuterol (PROVENTIL HFA;VENTOLIN HFA) 108 (90 Base) MCG/ACT inhaler Inhale 2 puffs into the lungs every 6 (six) hours as needed for wheezing or shortness of breath.    [provider]  cephALEXin (KEFLEX) 500 MG capsule Take 1 capsule (500 mg total) by mouth 3 (three) times daily. 05/02/18   Faythe GheeFisher, Susan W, PA-C    Allergies Other  No family history on file.  Social History Social History   Tobacco Use  . Smoking status: Current Every Day Smoker    Packs/day: 0.50    Types: Cigarettes  . Smokeless tobacco: Never Used  Substance Use Topics  . Alcohol use: Yes    Comment: rarely  . Drug use: No    Review of Systems  Constitutional: Negative for fever. Eyes: Negative for visual changes. ENT: Negative for sore throat. Cardiovascular: Negative for chest pain. Respiratory: Negative for shortness of breath. Gastrointestinal: Negative for abdominal pain, vomiting and diarrhea. Genitourinary: Negative for dysuria, hematuria, discharge, lesions Musculoskeletal: Negative  for back pain. Skin: Negative for rash. Neurological: Negative for headaches, focal weakness or numbness. ____________________________________________  PHYSICAL EXAM:  VITAL SIGNS: ED Triage Vitals [06/25/18 1440]  Enc Vitals Group     BP 139/85     Pulse Rate 98     Resp 18     Temp 98.7 F (37.1 C)     Temp Source Oral     SpO2 98 %     Weight 185 lb (83.9 kg)     Height 5\' 8"  (1.727 m)     Head Circumference      Peak Flow      Pain Score 0     Pain Loc      Pain Edu?      Excl. in GC?     Constitutional: Alert and oriented. Well appearing and in no distress. Head: Normocephalic and atraumatic. Eyes: Conjunctivae are normal. Normal extraocular movements Cardiovascular: Normal rate, regular rhythm.  Respiratory: Normal respiratory effort.  GU: deferred Musculoskeletal: Nontender with normal range of motion in all extremities.  Neurologic:  Normal gait without ataxia. Normal speech and language. No gross focal neurologic deficits are appreciated. Skin:  Skin is warm, dry and intact. No rash noted. Psychiatric: Mood is nervous and affect is distaught. Patient exhibits appropriate insight and judgment. ____________________________________________   LABS (pertinent positives/negatives) deferred ____________________________________________  INITIAL IMPRESSION / ASSESSMENT AND PLAN / ED COURSE  Discussed at length, the exposure, transmission, diagnosis, and treatment of potential HSV infection. The "source" patient relates that she never had a painful lesion. The patient has  not current GU lesions. He is advised that culture testing requires an active lesion. He is further advised that blood testing may not be reliable in an asymptomatic patient. The patient and his girlfriend are advised to report to the [facility] for confirmation blood test of the 31-weeks pregnant girlfriend (source) and appropriate treatment; as well as counseling for suggested testing for this patient. All of  the patient and his girlfriend's questions were encouraged and answered.  ____________________________________________  FINAL CLINICAL IMPRESSION(S) / ED DIAGNOSES  Final diagnoses:  Concern about STD in male without diagnosis  Possible exposure to STD      Lavin Petteway, Charlesetta Ivory, PA-C 06/28/18 1458    Nita Sickle, MD 06/28/18 2226

## 2018-06-25 NOTE — ED Notes (Signed)
Patient denies pain and is resting comfortably.  

## 2018-06-25 NOTE — ED Notes (Signed)
AAAOx3.  Skin warm and dry.NAD

## 2018-06-25 NOTE — Discharge Instructions (Addendum)
You should follow-up with the health department for further testing and counseling.

## 2018-10-12 IMAGING — CT CT RENAL STONE PROTOCOL
2 of 4 series · 16 of 46 positions shown, 18 images · non-contrast
Comparison: October 30, 2014

CLINICAL DATA: Gross hemorrhage this morning with low abdomen pain.

EXAM:
CT ABDOMEN AND PELVIS WITHOUT CONTRAST
TECHNIQUE: Multidetector CT imaging of the abdomen and pelvis was performed
following the standard protocol without IV contrast.

[Series 2: stone full standard · axial · 0.83mm/px · z∈[-510,-85]mm · 13 of 93 slices shown, 15 images]
[im 4/93  soft-tissue]
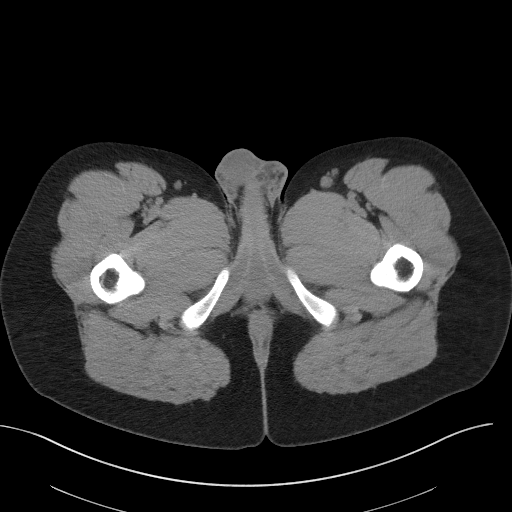
[im 4/93  bone]
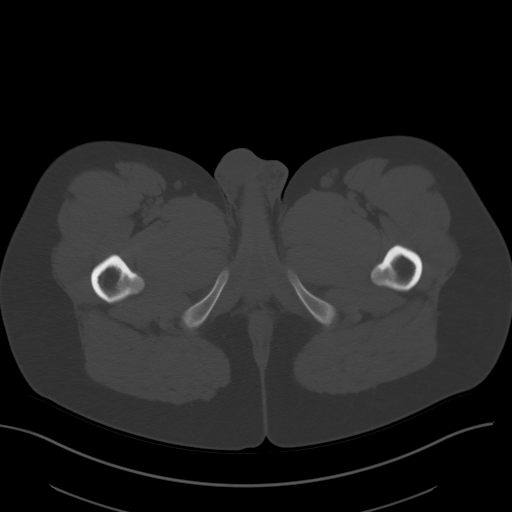
[im 12/93  soft-tissue]
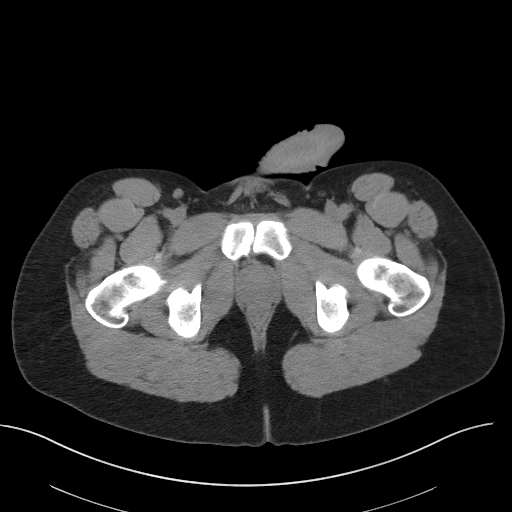
[im 19/93  soft-tissue]
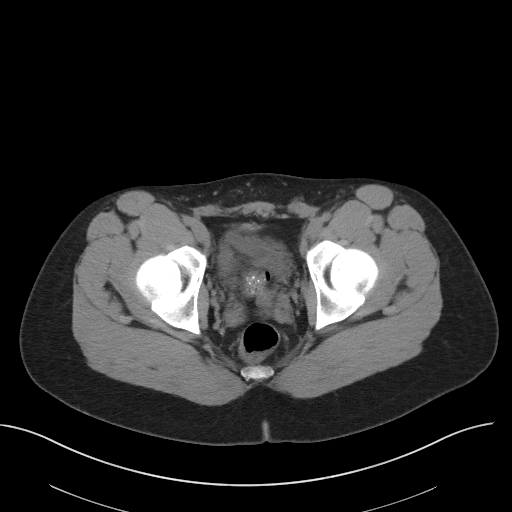
[im 26/93  soft-tissue]
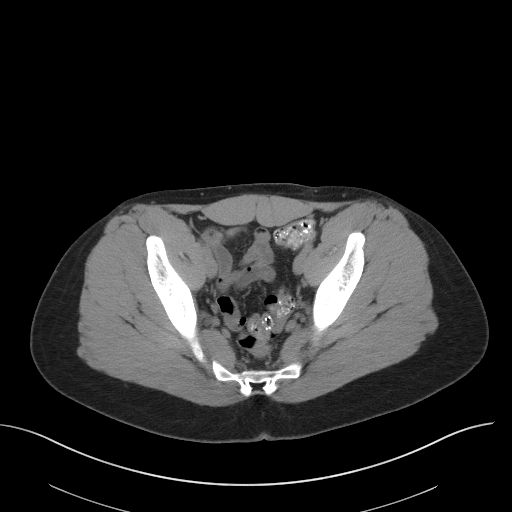
[im 34/93  soft-tissue]
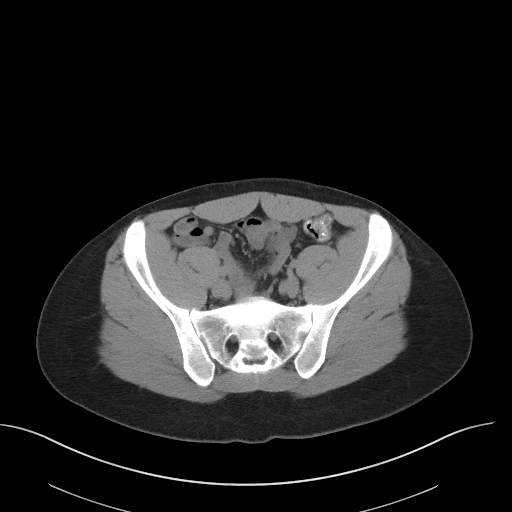
[im 41/93  soft-tissue]
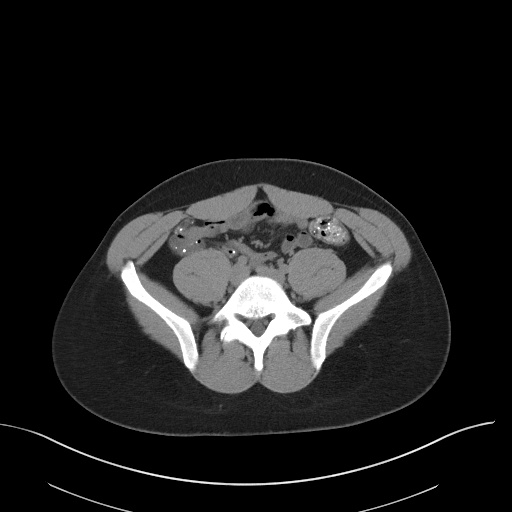
[im 48/93  soft-tissue]
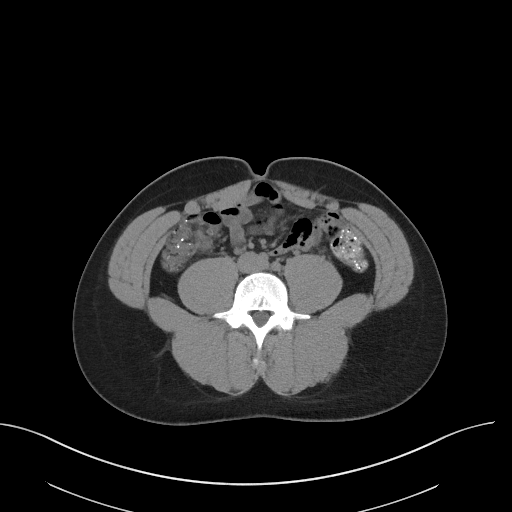
[im 52/93  soft-tissue]
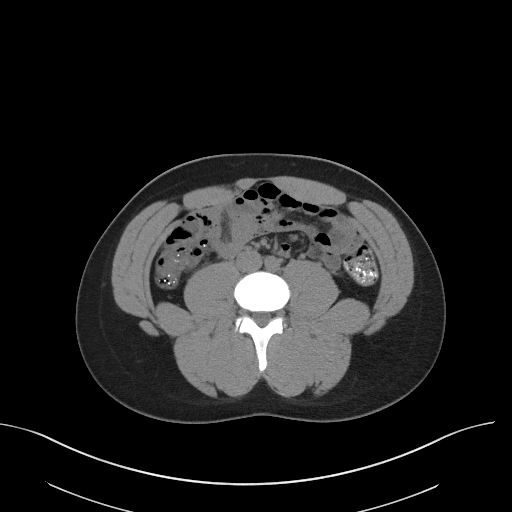
[im 59/93  soft-tissue]
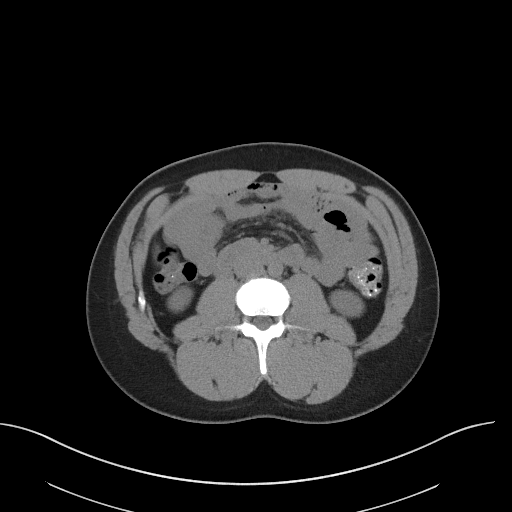
[im 59/93  bone]
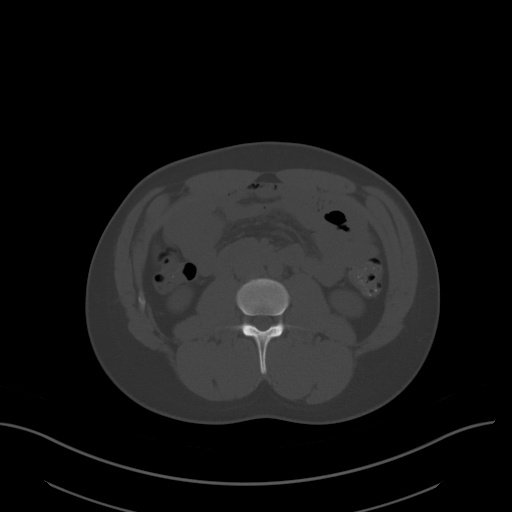
[im 67/93  soft-tissue]
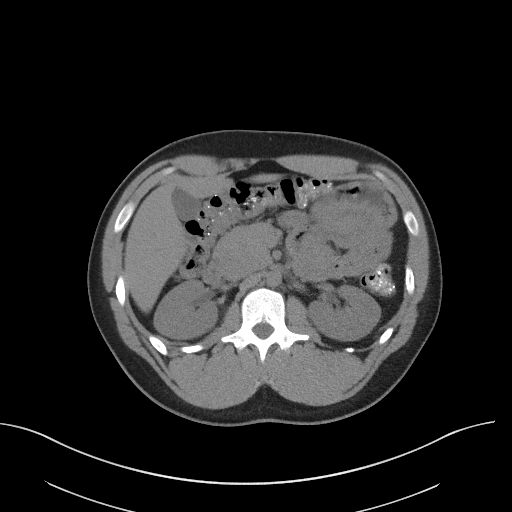
[im 74/93  soft-tissue]
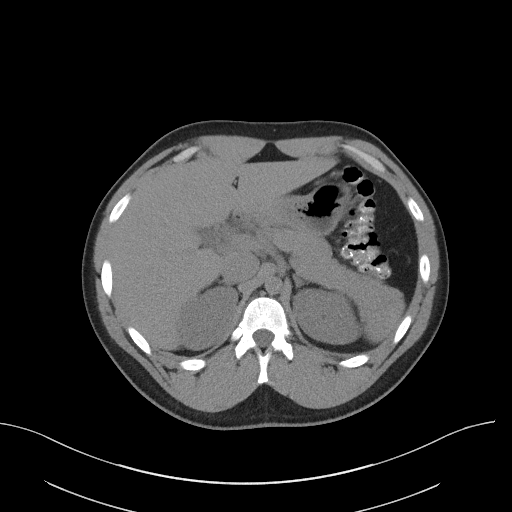
[im 81/93  soft-tissue]
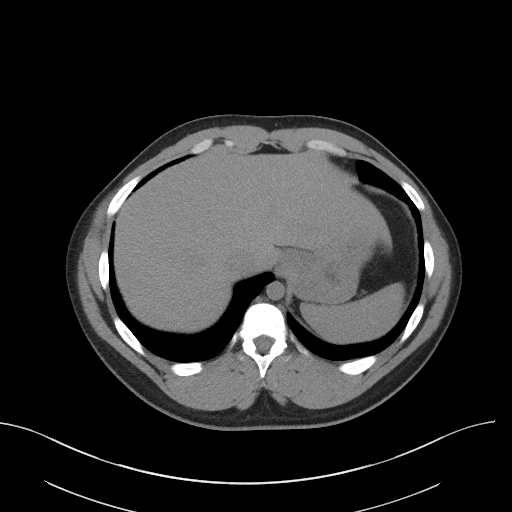
[im 89/93  soft-tissue]
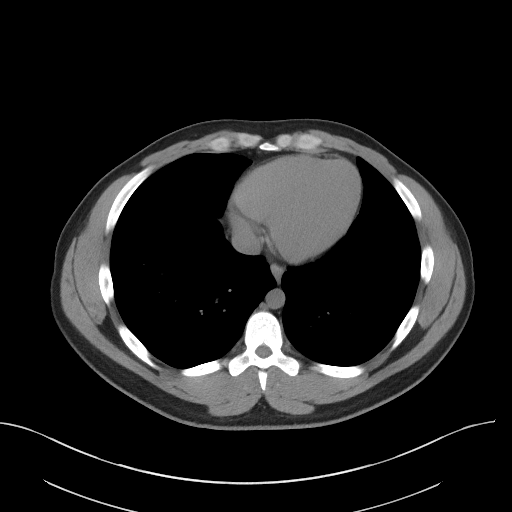

[Series 5: coronal · coronal · 0.71mm/px · 3 of 131 slices shown]
[im 44/131  soft-tissue]
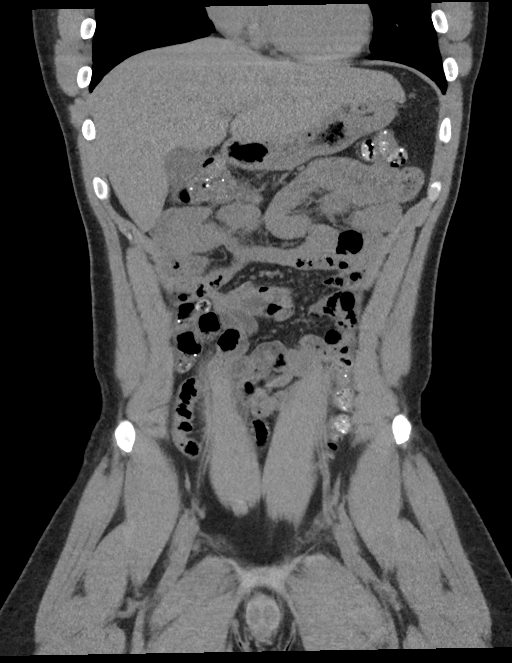
[im 58/131  soft-tissue]
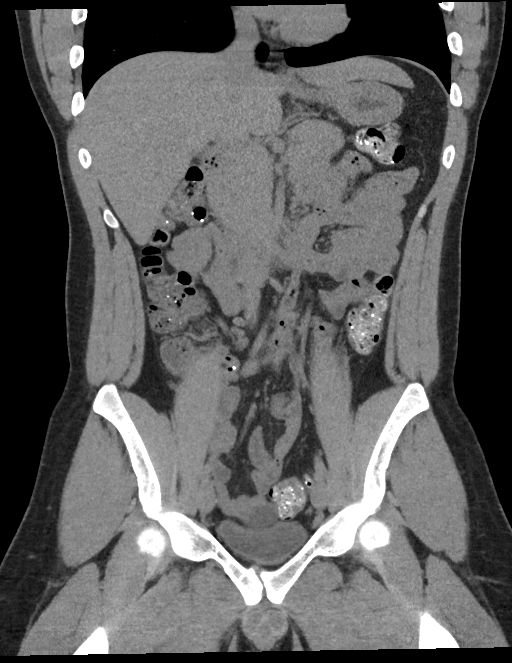
[im 73/131  soft-tissue]
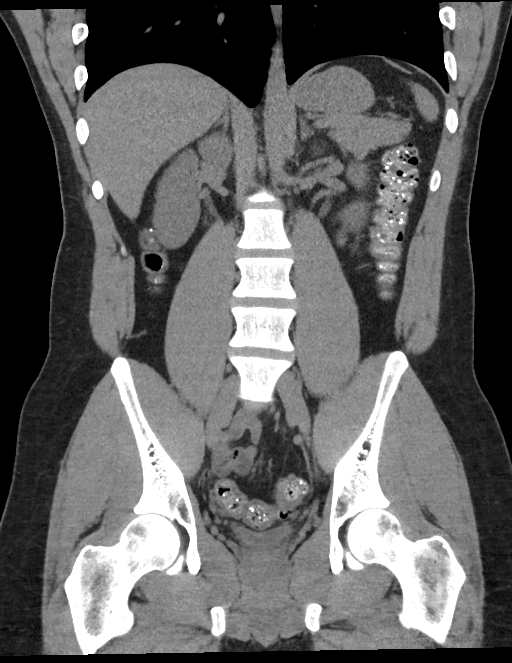

[16 of 46 positions shown; findings below may reference images not displayed]

FINDINGS: Lower chest: No acute abnormality.

Hepatobiliary: No focal liver abnormality is seen. No gallstones,
gallbladder wall thickening, or biliary dilatation.

Pancreas: Unremarkable. No pancreatic ductal dilatation or
surrounding inflammatory changes.

Spleen: Normal in size without focal abnormality.

Adrenals/Urinary Tract: Adrenal glands are unremarkable. Kidneys are
normal, without renal calculi, focal lesion, or hydronephrosis.
Bladder is unremarkable.

Stomach/Bowel: Stomach is within normal limits. Appendix appears
normal. No evidence of bowel wall thickening, distention, or
inflammatory changes. There are innumerable tiny radiopaque
densities identified in the bowel content throughout the colon
probably due to ingested material.

Vascular/Lymphatic: No significant vascular findings are present. No
enlarged abdominal or pelvic lymph nodes.

Reproductive: Prostate is unremarkable.

Other: None.

Musculoskeletal: No acute or significant osseous findings.
IMPRESSION: No acute abnormality identified in the abdomen and pelvis. No bowel
obstruction. Innumerable tiny radiopaque densities identified in the
bowel content throughout the colon probably due to ingested
material.

## 2018-11-19 ENCOUNTER — Other Ambulatory Visit: Payer: Self-pay

## 2018-11-19 ENCOUNTER — Emergency Department
Admission: EM | Admit: 2018-11-19 | Discharge: 2018-11-19 | Disposition: A | Discharge status: Home / Self Care | Payer: Self-pay | Attending: Emergency Medicine | Admitting: Emergency Medicine

## 2018-11-19 ENCOUNTER — Emergency Department: Payer: Self-pay

## 2018-11-19 ENCOUNTER — Encounter: Payer: Self-pay | Admitting: Emergency Medicine

## 2018-11-19 DIAGNOSIS — F17210 Nicotine dependence, cigarettes, uncomplicated: Secondary | ICD-10-CM | POA: Insufficient documentation

## 2018-11-19 DIAGNOSIS — J45909 Unspecified asthma, uncomplicated: Secondary | ICD-10-CM | POA: Insufficient documentation

## 2018-11-19 DIAGNOSIS — Y929 Unspecified place or not applicable: Secondary | ICD-10-CM | POA: Insufficient documentation

## 2018-11-19 DIAGNOSIS — Y998 Other external cause status: Secondary | ICD-10-CM | POA: Insufficient documentation

## 2018-11-19 DIAGNOSIS — X500XXA Overexertion from strenuous movement or load, initial encounter: Secondary | ICD-10-CM | POA: Insufficient documentation

## 2018-11-19 DIAGNOSIS — Y9389 Activity, other specified: Secondary | ICD-10-CM | POA: Insufficient documentation

## 2018-11-19 DIAGNOSIS — S39012A Strain of muscle, fascia and tendon of lower back, initial encounter: Secondary | ICD-10-CM | POA: Insufficient documentation

## 2018-11-19 MED ORDER — PREDNISONE 10 MG PO TABS: ORAL_TABLET | ORAL | 0 refills | Status: DC

## 2018-11-19 MED ORDER — METHOCARBAMOL 500 MG PO TABS: 500.0000 mg | ORAL_TABLET | Freq: Four times a day (QID) | ORAL | 0 refills | Status: DC | PRN

## 2018-11-19 MED ORDER — KETOROLAC TROMETHAMINE 30 MG/ML IJ SOLN
30.0000 mg | Freq: Once | INTRAMUSCULAR | Status: AC
  Administered 2018-11-19: 30 mg via INTRAMUSCULAR
  Filled 2018-11-19: qty 1

## 2018-11-19 NOTE — ED Notes (Signed)
See triage note   Presents with back pain  States he developed pain to back after moving a TV  Pain increases with movement and bending  Ambulates well to treatment room  Also denies any urinary sxs'

## 2018-11-19 NOTE — ED Provider Notes (Signed)
[facility] Emergency Department Provider Note  ____________________________________________   First MD Initiated Contact with Patient 11/19/18 1001     (approximate)  I have reviewed the triage vital signs and the nursing notes.   HISTORY  Chief Complaint Back Pain   HPI Jon BillingsMichael C Kaczmarek is a 27 y.o. male resents to the ED with complaint of low back pain.  Patient states that he lifted several heavy items over the weekend including a TV.  It was soon after that he developed low back pain.  He denies any radiation into his lower extremities.  He denies any urinary symptoms or history of kidney stone.  He took some over-the-counter medication yesterday without any relief.  Currently rates his pain as 10/10.   Past Medical History:  Diagnosis Date  . Asthma     There are no active problems to display for this patient.   Past Surgical History:  Procedure Laterality Date  . ABDOMINAL SURGERY      Prior to Admission medications   Medication Sig Start Date End Date Taking? Authorizing Provider  albuterol (PROVENTIL HFA;VENTOLIN HFA) 108 (90 Base) MCG/ACT inhaler Inhale 2 puffs into the lungs every 6 (six) hours as needed for wheezing or shortness of breath.    [provider]  methocarbamol (ROBAXIN) 500 MG tablet Take 1 tablet (500 mg total) by mouth every 6 (six) hours as needed. 11/19/18   Tommi RumpsSummers, Rhonda L, PA-C  predniSONE (DELTASONE) 10 MG tablet Take 3 tablets once a day for 5 days 11/19/18   Tommi RumpsSummers, Rhonda L, PA-C    Allergies Other  History reviewed. No pertinent family history.  Social History Social History   Tobacco Use  . Smoking status: Current Every Day Smoker    Packs/day: 0.50    Types: Cigarettes  . Smokeless tobacco: Never Used  Substance Use Topics  . Alcohol use: Yes    Comment: rarely  . Drug use: No    Review of Systems Constitutional: No fever/chills Eyes: No visual changes. Cardiovascular: Denies chest  pain. Respiratory: Denies shortness of breath. Gastrointestinal: No abdominal pain.  No nausea, no vomiting.  No diarrhea.  No constipation. Genitourinary: Negative for dysuria.  Negative for history of kidney stones. Musculoskeletal: Positive for low back pain with no previous back injury. Skin: Negative for rash. Neurological: Negative for headaches, focal weakness or numbness. ___________________________________________   PHYSICAL EXAM:  VITAL SIGNS: ED Triage Vitals  Enc Vitals Group     BP 11/19/18 0849 (!) 147/91     Pulse Rate 11/19/18 0849 90     Resp 11/19/18 0849 20     Temp 11/19/18 0849 98.4 F (36.9 C)     Temp Source 11/19/18 0849 Oral     SpO2 11/19/18 0849 99 %     Weight 11/19/18 0850 200 lb (90.7 kg)     Height 11/19/18 0850 5\' 9"  (1.753 m)     Head Circumference --      Peak Flow --      Pain Score 11/19/18 0854 10     Pain Loc --      Pain Edu? --      Excl. in GC? --    Constitutional: Alert and oriented. Well appearing and in no acute distress. Eyes: Conjunctivae are normal.  Head: Atraumatic. Nose: No congestion/rhinnorhea. Neck: No stridor.   Cardiovascular: Normal rate, regular rhythm. Grossly normal heart sounds.  Good peripheral circulation. Respiratory: Normal respiratory effort.  No retractions. Lungs CTAB. Gastrointestinal:  Soft and nontender. No distention.  Musculoskeletal: Examination of the back there is no gross deformity however there is some tenderness on palpation of the lower lumbar spine and paravertebral muscles bilaterally.  Range of motion is restricted secondary to discomfort.  Patient is able to ambulate without assistance but has some difficulty getting to a standing position from sitting after period of time.  Good muscle strength bilaterally straight leg raises are negative. Neurologic:  Normal speech and language. No gross focal neurologic deficits are appreciated.  Reflexes are 2+ bilaterally.  No gait instability. Skin:  Skin  is warm, dry and intact.  No ecchymosis, abrasions or soft tissue swelling noted. Psychiatric: Mood and affect are normal. Speech and behavior are normal.  ____________________________________________   LABS (all labs ordered are listed, but only abnormal results are displayed)  Labs Reviewed - No data to display  RADIOLOGY  Official radiology report(s): Dg Lumbar Spine 2-3 Views  Result Date: 11/19/2018 CLINICAL DATA:  Pain after moving heavy object EXAM: LUMBAR SPINE - 2-3 VIEW COMPARISON:  None. FINDINGS: Frontal, lateral, and spot lumbosacral lateral images were obtained. There are 5 non-rib-bearing lumbar type vertebral bodies. There is no fracture or spondylolisthesis. The disc spaces appear normal. No appreciable arthropathy noted. IMPRESSION: No fracture or spondylolisthesis.  No evident arthropathy. Electronically Signed   By: Bretta Bang III M.D.   On: 11/19/2018 11:01    ____________________________________________   PROCEDURES  Procedure(s) performed: None  Procedures  Critical Care performed: No  ____________________________________________   INITIAL IMPRESSION / ASSESSMENT AND PLAN / ED COURSE  As part of my medical decision making, I reviewed the following data within the electronic MEDICAL RECORD NUMBER Notes from prior ED visits and  Controlled Substance Database  Patient presents to the ED with complaint of low back pain after moving several heavy items over the weekend.  Patient has taken over-the-counter medication without any relief.  He was given Toradol 30 mg IM and x-rays were discussed with him which was reassuring.  Patient was discharged with prescription for prednisone and Robaxin.  He is aware that he will need to only take the Robaxin when he does not plan to drive.  Patient is to follow-up with his PCP or 1 the clinics listed on his discharge papers if there is any continued problems.    ____________________________________________   FINAL  CLINICAL IMPRESSION(S) / ED DIAGNOSES  Final diagnoses:  Acute myofascial strain of lumbar region, initial encounter     ED Discharge Orders         Ordered    predniSONE (DELTASONE) 10 MG tablet     11/19/18 1136    methocarbamol (ROBAXIN) 500 MG tablet  Every 6 hours PRN     11/19/18 1136           Note:  This document was prepared using Dragon voice recognition software and may include unintentional dictation errors.    Tommi Rumps, PA-C 11/19/18 1539    Minna Antis, MD 11/19/18 1549

## 2018-11-19 NOTE — ED Triage Notes (Signed)
Pt arrived via POV with reports of low back pain, pt states his back pain started after moving a heavy TV last night pt states the is worse when bending over.   Pt has tried to use Advil and Aleve but no relief.

## 2018-11-19 NOTE — Discharge Instructions (Signed)
[facility] or call and make an appointment with 1 the clinics listed on your discharge papers.  You should also call and make an appointment to establish a primary care provider.  Take medication only as directed.  Prednisone is 3 tablets once a day for the next 5 days and Robaxin is 1 every 6 hours if you do not plan to drive or operate machinery.  You may use moist heat or ice to your back as needed for discomfort.

## 2021-05-24 ENCOUNTER — Encounter: Payer: Self-pay | Admitting: Emergency Medicine

## 2021-05-24 ENCOUNTER — Emergency Department
Admission: EM | Admit: 2021-05-24 | Discharge: 2021-05-24 | Disposition: A | Discharge status: Home / Self Care | Payer: Self-pay | Attending: Emergency Medicine | Admitting: Emergency Medicine

## 2021-05-24 ENCOUNTER — Other Ambulatory Visit: Payer: Self-pay

## 2021-05-24 DIAGNOSIS — F17210 Nicotine dependence, cigarettes, uncomplicated: Secondary | ICD-10-CM | POA: Insufficient documentation

## 2021-05-24 DIAGNOSIS — R21 Rash and other nonspecific skin eruption: Secondary | ICD-10-CM | POA: Insufficient documentation

## 2021-05-24 DIAGNOSIS — J45909 Unspecified asthma, uncomplicated: Secondary | ICD-10-CM | POA: Insufficient documentation

## 2021-05-24 MED ORDER — PREDNISONE 20 MG PO TABS: 20.0000 mg | ORAL_TABLET | Freq: Two times a day (BID) | ORAL | 0 refills | Status: AC

## 2021-05-24 MED ORDER — FAMOTIDINE 20 MG PO TABS: 20.0000 mg | ORAL_TABLET | Freq: Two times a day (BID) | ORAL | 0 refills | Status: DC

## 2021-05-24 MED ORDER — TRIAMCINOLONE ACETONIDE 0.1 % EX OINT
1.0000 | TOPICAL_OINTMENT | Freq: Two times a day (BID) | CUTANEOUS | 0 refills | Status: AC
Start: 2021-05-24 — End: ?

## 2021-05-24 NOTE — Discharge Instructions (Addendum)
Take the steroid as directed. Follow-up with your provider for return as needed.

## 2021-05-24 NOTE — ED Triage Notes (Signed)
C/O hive rash to right arm and chest and neck.  States took a benadryl, but rash became worse while at work in the heat.  AAOx3.  Skin warm and dry. NAD

## 2021-05-24 NOTE — ED Provider Notes (Signed)
[facility] Emergency Department Provider Note ____________________________________________  Time seen: 1041  I have reviewed the triage vital signs and the nursing notes.  HISTORY  Chief Complaint  Rash   HPI Jeffrey Aguirre is a 30 y.o. male with noncontributory medical history, presents himself to the ED for evaluation of irritated skin noted primarily to the right antecubital region and to the anterior neck.  Patient took a dose of Benadryl last night with some mild itching related to the rash.  He denies any blister formations, skin breakdown, or drainage.  He is unclear of any known allergens or triggers.  He denies any recent yardwork, sick contacts, irritant contacts, or recent travel.  Patient gives a remote history of asthma but denies any significant eczema or atopic dermatitis history.  Past Medical History:  Diagnosis Date   Asthma     There are no problems to display for this patient.   Past Surgical History:  Procedure Laterality Date   ABDOMINAL SURGERY      Prior to Admission medications   Medication Sig Start Date End Date Taking? Authorizing Provider  famotidine (PEPCID) 20 MG tablet Take 1 tablet (20 mg total) by mouth 2 (two) times daily for 10 days. 05/24/21 06/03/21 Yes Verna Hamon, Charlesetta Ivory, PA-C  predniSONE (DELTASONE) 20 MG tablet Take 1 tablet (20 mg total) by mouth 2 (two) times daily with a meal for 5 days. 05/24/21 05/29/21 Yes Rowdy Guerrini, Charlesetta Ivory, PA-C  triamcinolone ointment (KENALOG) 0.1 % Apply 1 application topically 2 (two) times daily. 05/24/21  Yes Caymen Dubray, Charlesetta Ivory, PA-C  albuterol (PROVENTIL HFA;VENTOLIN HFA) 108 (90 Base) MCG/ACT inhaler Inhale 2 puffs into the lungs every 6 (six) hours as needed for wheezing or shortness of breath.    [provider]  methocarbamol (ROBAXIN) 500 MG tablet Take 1 tablet (500 mg total) by mouth every 6 (six) hours as needed. 11/19/18   Tommi Rumps, PA-C     Allergies Other  History reviewed. No pertinent family history.  Social History Social History   Tobacco Use   Smoking status: Every Day    Packs/day: 0.50    Pack years: 0.00    Types: Cigarettes   Smokeless tobacco: Never  Substance Use Topics   Alcohol use: Yes    Comment: rarely   Drug use: No    Review of Systems  Constitutional: Negative for fever. Eyes: Negative for visual changes. ENT: Negative for sore throat. Cardiovascular: Negative for chest pain. Respiratory: Negative for shortness of breath. Gastrointestinal: Negative for abdominal pain, vomiting and diarrhea. Genitourinary: Negative for dysuria. Musculoskeletal: Negative for back pain. Skin: Positive for rash. Neurological: Negative for headaches, focal weakness or numbness. ____________________________________________  PHYSICAL EXAM:  VITAL SIGNS: ED Triage Vitals  Enc Vitals Group     BP 05/24/21 1025 (!) 157/95     Pulse Rate 05/24/21 1025 84     Resp 05/24/21 1025 16     Temp 05/24/21 1025 97.8 F (36.6 C)     Temp Source 05/24/21 1025 Oral     SpO2 05/24/21 1025 100 %     Weight 05/24/21 1019 199 lb 15.3 oz (90.7 kg)     Height 05/24/21 1019 5\' 9"  (1.753 m)     Head Circumference --      Peak Flow --      Pain Score 05/24/21 1019 0     Pain Loc --      Pain Edu? --  Excl. in GC? --     Constitutional: Alert and oriented. Well appearing and in no distress. Head: Normocephalic and atraumatic. Eyes: Conjunctivae are normal. Normal extraocular movements Neck: Supple. Normal ROM.  Hematological/Lymphatic/Immunological: No cervical lymphadenopathy. Cardiovascular: Normal rate, regular rhythm. Normal distal pulses. Respiratory: Normal respiratory effort. No wheezes/rales/rhonchi. Gastrointestinal: Soft and nontender. No distention. Musculoskeletal: Nontender with normal range of motion in all extremities.  Neurologic:  Normal gait without ataxia. Normal speech and language. No  gross focal neurologic deficits are appreciated. Skin:  Skin is warm, dry and intact.  Patient with a maculopapular rash noted to the right antecubital space.  No lymphangitis, weeping, purulence, blister formation is noted.  Similar erythematous macular rash noted to the anterior neck at the collar. ____________________________________________  PROCEDURES   Procedures ____________________________________________   INITIAL IMPRESSION / ASSESSMENT AND PLAN / ED COURSE  As part of my medical decision making, I reviewed the following data within the electronic MEDICAL RECORD NUMBER Notes from prior ED visits  DDX: contact dermatitis, eczema flare, cellulitis  Patient with ED evaluation of some erythematous and irritated skin to the right antecubital area and to the anterior neck.  Evaluation of the patient clinically is not reveal any signs of any acute infectious process, cellulitis, or folliculitis.  Symptoms likely represent are irritant or contact dermatitis.  Patient is discharged with a prescription for triamcinolone ointment, as well as a steroid course.  He will keep the skin cool, dry, and covered.  He is encouraged to follow-up with a primary provider or return to the ED if necessary.   Jeffrey Aguirre was evaluated in Emergency Department on 05/24/2021 for the symptoms described in the history of present illness. He was evaluated in the context of the global COVID-19 pandemic, which necessitated consideration that the patient might be at risk for infection with the SARS-CoV-2 virus that causes COVID-19. Institutional protocols and algorithms that pertain to the evaluation of patients at risk for COVID-19 are in a state of rapid change based on information released by regulatory bodies including the CDC and federal and state organizations. These policies and algorithms were followed during the patient's care in the ED. ____________________________________________  FINAL CLINICAL IMPRESSION(S) /  ED DIAGNOSES  Final diagnoses:  Rash and nonspecific skin eruption      Karmen Stabs, Charlesetta Ivory, PA-C 05/24/21 1537    Shaune Pollack, MD 05/24/21 1918

## 2023-08-11 ENCOUNTER — Emergency Department
Admission: EM | Admit: 2023-08-11 | Discharge: 2023-08-11 | Disposition: A | Discharge status: Home / Self Care | Payer: Self-pay | Attending: Emergency Medicine | Admitting: Emergency Medicine

## 2023-08-11 ENCOUNTER — Other Ambulatory Visit: Payer: Self-pay

## 2023-08-11 DIAGNOSIS — L739 Follicular disorder, unspecified: Secondary | ICD-10-CM | POA: Insufficient documentation

## 2023-08-11 NOTE — ED Triage Notes (Signed)
Pt presents to ED with c/o of localized rash to L thigh. NAD noted.

## 2023-08-11 NOTE — ED Provider Notes (Signed)
[facility] Provider Note    Event Date/Time   First MD Initiated Contact with Patient 08/11/23 [phone]     (approximate)   History   Rash   HPI \ Jeffrey Aguirre is a 32 y.o. male with PMH of asthma who presents for evaluation of a rash localized to his anterior thighs.  Patient states the area is very itchy, he has noticed some red spots that formed into a whitehead and then popped and drained on their own.  Patient works inside a [employer] that is very hot and works with water so is often soaked through his clothes.     Physical Exam   Triage Vital Signs: ED Triage Vitals  Encounter Vitals Group     BP 08/11/23 0901 135/89     Systolic BP Percentile --      Diastolic BP Percentile --      Pulse Rate 08/11/23 0859 85     Resp 08/11/23 0859 17     Temp 08/11/23 0859 98 F (36.7 C)     Temp Source 08/11/23 0859 Oral     SpO2 08/11/23 0859 96 %     Weight 08/11/23 0944 199 lb 15.3 oz (90.7 kg)     Height 08/11/23 0944 5\' 9"  (1.753 m)     Head Circumference --      Peak Flow --      Pain Score 08/11/23 0900 2     Pain Loc --      Pain Education --      Exclude from Growth Chart --     Most recent vital signs: Vitals:   08/11/23 0859 08/11/23 0901  BP:  135/89  Pulse: 85   Resp: 17   Temp: 98 F (36.7 C)   SpO2: 96%     General: Awake, no distress.  CV:  Good peripheral perfusion.  Resp:  Normal effort.  Abd:  No distention.  Other:  Small erythematous papules, with healing pustules surrounding the hair follicles on the anterior thighs, no induration or fluctuance.   ED Results / Procedures / Treatments   Labs (all labs ordered are listed, but only abnormal results are displayed) Labs Reviewed - No data to display   PROCEDURES:  Critical Care performed: No  Procedures   MEDICATIONS ORDERED IN ED: Medications - No data to display   IMPRESSION / MDM / ASSESSMENT AND PLAN / ED COURSE  I reviewed the triage vital signs and  the nursing notes.                             32 year old male presents for evaluation of a rash to the anterior thighs.  VSS in triage and patient NAD on exam.  Differential diagnosis includes, but is not limited to, cellulitis, folliculitis, abscess.  Patient's presentation is most consistent with acute, uncomplicated illness.  Patient's diagnosis is most consistent with folliculitis.  I believe this is exacerbated by his working conditions.  I advised patient on good hygiene and specifically recommended he use a benzyl peroxide wash and exfoliate the area.  There are no signs of cellulitis or abscess development at this time so I do not feel that antibiotics are indicated.  As I was about to discharge patient he raised concerns about bump on his face.  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  I will give him a referral for general surgery to discuss possible  removal.  Patient was agreeable to plan, all questions were answered and he was stable at discharge.      FINAL CLINICAL IMPRESSION(S) / ED DIAGNOSES   Final diagnoses:  Folliculitis     Rx / DC Orders   ED Discharge Orders     None        Note:  This document was prepared using Dragon voice recognition software and may include unintentional dictation errors.   Cameron Ali, PA-C 08/11/23 1045    Sharman Cheek, MD 08/12/23 1530

## 2023-08-11 NOTE — Discharge Instructions (Addendum)
You can take a daily nondrowsy allergy medication like Claritin, Zyrtec or Allegra to help with your itching symptoms.  Please purchase a benzyl peroxide face wash or body wash. I recommend Panoxyl.  Wash the area with this daily.  I also recommend that you use a washcloth or a loofah for additional exfoliation.  You do not have signs of infection or abscess at this point.  Keep an eye out for increased swelling, redness, pain or large pustule development.  If you present with any of the symptoms please return to the ED.
